# Patient Record
Sex: Male | Born: 1990 | Race: White | Hispanic: No | Marital: Single | Smoking: Never smoker
Health system: Southern US, Community
[De-identification: ages and names within clinical notes are randomized; demographics above are authoritative.]

## PROBLEM LIST (undated history)

## (undated) DIAGNOSIS — K219 Gastro-esophageal reflux disease without esophagitis: Secondary | ICD-10-CM

## (undated) DIAGNOSIS — E785 Hyperlipidemia, unspecified: Secondary | ICD-10-CM

## (undated) DIAGNOSIS — R519 Headache, unspecified: Secondary | ICD-10-CM

## (undated) HISTORY — DX: Headache, unspecified: R51.9

## (undated) HISTORY — DX: Gastro-esophageal reflux disease without esophagitis: K21.9

## (undated) HISTORY — DX: Hyperlipidemia, unspecified: E78.5

## (undated) HISTORY — PX: WISDOM TOOTH EXTRACTION: SHX21

---

## 2018-11-25 ENCOUNTER — Other Ambulatory Visit: Payer: Self-pay

## 2018-11-26 ENCOUNTER — Encounter: Payer: Self-pay | Admitting: Family Medicine

## 2018-11-26 ENCOUNTER — Ambulatory Visit (INDEPENDENT_AMBULATORY_CARE_PROVIDER_SITE_OTHER): Payer: Managed Care, Other (non HMO) | Admitting: Family Medicine

## 2018-11-26 VITALS — BP 116/84 | HR 82 | Temp 98.4°F | Ht 70.25 in | Wt 211.0 lb

## 2018-11-26 DIAGNOSIS — Z13220 Encounter for screening for lipoid disorders: Secondary | ICD-10-CM | POA: Diagnosis not present

## 2018-11-26 DIAGNOSIS — Z0000 Encounter for general adult medical examination without abnormal findings: Secondary | ICD-10-CM | POA: Insufficient documentation

## 2018-11-26 LAB — LIPID PANEL
CHOL/HDL RATIO: 6
Cholesterol: 234 mg/dL — ABNORMAL HIGH (ref 0–200)
HDL: 37.2 mg/dL — ABNORMAL LOW (ref >39.00)
LDL Cholesterol: 174 mg/dL — ABNORMAL HIGH (ref 0–99)
NonHDL: 196.41
Triglycerides: 114 mg/dL (ref 0.0–149.0)
VLDL: 22.8 mg/dL (ref 0.0–40.0)

## 2018-11-26 LAB — COMPREHENSIVE METABOLIC PANEL
ALT: 49 U/L (ref 0–53)
AST: 21 U/L (ref 0–37)
Albumin: 4.7 g/dL (ref 3.5–5.2)
Alkaline Phosphatase: 63 U/L (ref 39–117)
BUN: 16 mg/dL (ref 6–23)
CO2: 30 mEq/L (ref 19–32)
Calcium: 9.9 mg/dL (ref 8.4–10.5)
Chloride: 102 mEq/L (ref 96–112)
Creatinine, Ser: 1.06 mg/dL (ref 0.40–1.50)
GFR: 82.53 mL/min (ref >60.00)
Glucose, Bld: 109 mg/dL — ABNORMAL HIGH (ref 70–99)
POTASSIUM: 4.2 meq/L (ref 3.5–5.1)
Sodium: 139 mEq/L (ref 135–145)
Total Bilirubin: 0.6 mg/dL (ref 0.2–1.2)
Total Protein: 7.4 g/dL (ref 6.0–8.3)

## 2018-11-26 LAB — TSH: TSH: 0.83 u[IU]/mL (ref 0.35–4.50)

## 2018-11-26 LAB — CBC
HCT: 47 % (ref 39.0–52.0)
Hemoglobin: 15.9 g/dL (ref 13.0–17.0)
MCHC: 33.7 g/dL (ref 30.0–36.0)
MCV: 88.4 fl (ref 78.0–100.0)
Platelets: 213 10*3/uL (ref 150.0–400.0)
RBC: 5.32 Mil/uL (ref 4.22–5.81)
RDW: 13 % (ref 11.5–15.5)
WBC: 5.2 10*3/uL (ref 4.0–10.5)

## 2018-11-26 NOTE — Assessment & Plan Note (Signed)
-  Well adult Orders Placed This Encounter  Procedures  . Comp Met (CMET)  . CBC  . Lipid Profile  . TSH  Screening: lipid Immunizations:  Up to date Anticipatory guidance/Risk factor reduction:  Per AVS

## 2018-11-26 NOTE — Patient Instructions (Signed)
Preventive Care 18-39 Years, Male Preventive care refers to lifestyle choices and visits with your health care provider that can promote health and wellness. What does preventive care include?   A yearly physical exam. This is also called an annual well check.  Dental exams once or twice a year.  Routine eye exams. Ask your health care provider how often you should have your eyes checked.  Personal lifestyle choices, including: ? Daily care of your teeth and gums. ? Regular physical activity. ? Eating a healthy diet. ? Avoiding tobacco and drug use. ? Limiting alcohol use. ? Practicing safe sex. What happens during an annual well check? The services and screenings done by your health care provider during your annual well check will depend on your age, overall health, lifestyle risk factors, and family history of disease. Counseling Your health care provider may ask you questions about your:  Alcohol use.  Tobacco use.  Drug use.  Emotional well-being.  Home and relationship well-being.  Sexual activity.  Eating habits.  Work and work environment. Screening You may have the following tests or measurements:  Height, weight, and BMI.  Blood pressure.  Lipid and cholesterol levels. These may be checked every 5 years starting at age 20.  Diabetes screening. This is done by checking your blood sugar (glucose) after you have not eaten for a while (fasting).  Skin check.  Hepatitis C blood test.  Hepatitis B blood test.  Sexually transmitted disease (STD) testing. Discuss your test results, treatment options, and if necessary, the need for more tests with your health care provider. Vaccines Your health care provider may recommend certain vaccines, such as:  Influenza vaccine. This is recommended every year.  Tetanus, diphtheria, and acellular pertussis (Tdap, Td) vaccine. You may need a Td booster every 10 years.  Varicella vaccine. You may need this if you  have not been vaccinated.  HPV vaccine. If you are 26 or younger, you may need three doses over 6 months.  Measles, mumps, and rubella (MMR) vaccine. You may need at least one dose of MMR.You may also need a second dose.  Pneumococcal 13-valent conjugate (PCV13) vaccine. You may need this if you have certain conditions and have not been vaccinated.  Pneumococcal polysaccharide (PPSV23) vaccine. You may need one or two doses if you smoke cigarettes or if you have certain conditions.  Meningococcal vaccine. One dose is recommended if you are age 19-21 years and a first-year college student living in a residence hall, or if you have one of several medical conditions. You may also need additional booster doses.  Hepatitis A vaccine. You may need this if you have certain conditions or if you travel or work in places where you may be exposed to hepatitis A.  Hepatitis B vaccine. You may need this if you have certain conditions or if you travel or work in places where you may be exposed to hepatitis B.  Haemophilus influenzae type b (Hib) vaccine. You may need this if you have certain risk factors. Talk to your health care provider about which screenings and vaccines you need and how often you need them. This information is not intended to replace advice given to you by your health care provider. Make sure you discuss any questions you have with your health care provider. Document Released: 10/22/2001 Document Revised: 04/08/2017 Document Reviewed: 06/27/2015 Elsevier Interactive Patient Education  2019 [employer]  

## 2018-11-26 NOTE — Progress Notes (Signed)
Joshua Boone - 28 y.o. male MRN [id number]  Date of birth: 1990/10/06  Subjective Chief Complaint  Patient presents with  . Establish Care    NP , *fasting, CPE     HPI Joshua Boone is a 28 y.o. male here today to establish with new pcp and for annual exam.  He has no major concerns today.   He has no known medical problems.  His father passed away at age 60 from colon cancer.  He is fairly active and tries to follow a healthy diet.  He is a non-smoker and drinks EtOH socially.   Review of Systems  Constitutional: Negative for chills, fever, malaise/fatigue and weight loss.  HENT: Negative for congestion, ear pain and sore throat.   Eyes: Negative for blurred vision, double vision and pain.  Respiratory: Negative for cough and shortness of breath.   Cardiovascular: Negative for chest pain and palpitations.  Gastrointestinal: Negative for abdominal pain, blood in stool, constipation, heartburn and nausea.  Genitourinary: Negative for dysuria and urgency.  Musculoskeletal: Negative for joint pain and myalgias.  Neurological: Negative for dizziness and headaches.  Endo/Heme/Allergies: Does not bruise/bleed easily.  Psychiatric/Behavioral: Negative for depression. The patient is not nervous/anxious and does not have insomnia.     No Known Allergies  History reviewed. No pertinent past medical history.  Past Surgical History:  Procedure Laterality Date  . WISDOM TOOTH EXTRACTION     2007/2008    Social History   Socioeconomic History  . Marital status: Single    Spouse name: Not on file  . Number of children: Not on file  . Years of education: Not on file  . Highest education level: Not on file  Occupational History  . Not on file  Social Needs  . Financial resource strain: Not on file  . Food insecurity:    Worry: Not on file    Inability: Not on file  . Transportation needs:    Medical: Not on file    Non-medical: Not on file  Tobacco Use  . Smoking status: Never Smoker   . Smokeless tobacco: Never Used  Substance and Sexual Activity  . Alcohol use: Yes    Comment: socially   . Drug use: Not Currently  . Sexual activity: Yes    Birth control/protection: Condom  Lifestyle  . Physical activity:    Days per week: 3 days    Minutes per session: 40 min  . Stress: To some extent  Relationships  . Social connections:    Talks on phone: Twice a week    Gets together: Twice a week    Attends religious service: Not on file    Active member of club or organization: Not on file    Attends meetings of clubs or organizations: Not on file    Relationship status: Not on file  Other Topics Concern  . Not on file  Social History Narrative  . Not on file    Family History  Problem Relation Age of Onset  . Colon cancer Father 44  . Alzheimer's disease Paternal Grandfather     Health Maintenance  Topic Date Due  . HIV Screening  10/16/2004  . TETANUS/TDAP  10/16/2008  . INFLUENZA VACCINE  04/09/2018    ----------------------------------------------------------------------------------------------------------------------------------------------------------------------------------------------------------------- Physical Exam BP 116/84   Pulse 82   Temp 98.4 F (36.9 C) (Oral)   Ht 5' 10.25" (1.784 m)   Wt 211 lb (95.7 kg)   SpO2 97%   BMI 30.06 kg/m  Physical Exam Constitutional:      General: He is not in acute distress. HENT:     Head: Normocephalic and atraumatic.     Right Ear: External ear normal.     Left Ear: External ear normal.     Mouth/Throat:     Mouth: Mucous membranes are moist.  Eyes:     General: No scleral icterus. Neck:     Musculoskeletal: Normal range of motion.     Thyroid: No thyromegaly.  Cardiovascular:     Rate and Rhythm: Normal rate and regular rhythm.     Heart sounds: Normal heart sounds.  Pulmonary:     Effort: Pulmonary effort is normal.     Breath sounds: Normal breath sounds.  Abdominal:     General:  Bowel sounds are normal. There is no distension.     Palpations: Abdomen is soft.     Tenderness: There is no abdominal tenderness. There is no guarding.  Lymphadenopathy:     Cervical: No cervical adenopathy.  Skin:    General: Skin is warm and dry.     Findings: No rash.  Neurological:     Mental Status: He is alert and oriented to person, place, and time.     Cranial Nerves: No cranial nerve deficit.     Motor: No abnormal muscle tone.  Psychiatric:        Mood and Affect: Mood normal.        Behavior: Behavior normal.     ------------------------------------------------------------------------------------------------------------------------------------------------------------------------------------------------------------------- Assessment and Plan  Well adult exam -Well adult Orders Placed This Encounter  Procedures  . Comp Met (CMET)  . CBC  . Lipid Profile  . TSH  Screening: lipid Immunizations:  Up to date Anticipatory guidance/Risk factor reduction:  Per AVS

## 2018-12-09 ENCOUNTER — Encounter: Payer: Self-pay | Admitting: Family Medicine

## 2020-02-02 ENCOUNTER — Encounter: Payer: Self-pay | Admitting: Family Medicine

## 2020-03-01 ENCOUNTER — Other Ambulatory Visit: Payer: Self-pay

## 2020-03-02 ENCOUNTER — Ambulatory Visit (INDEPENDENT_AMBULATORY_CARE_PROVIDER_SITE_OTHER): Payer: BLUE CROSS/BLUE SHIELD | Admitting: Family Medicine

## 2020-03-02 ENCOUNTER — Encounter: Payer: Self-pay | Admitting: Family Medicine

## 2020-03-02 VITALS — BP 118/76 | HR 85 | Temp 98.7°F | Ht 71.0 in | Wt 209.4 lb

## 2020-03-02 DIAGNOSIS — Z0000 Encounter for general adult medical examination without abnormal findings: Secondary | ICD-10-CM

## 2020-03-02 LAB — LIPID PANEL
Cholesterol: 234 mg/dL — ABNORMAL HIGH (ref 0–200)
HDL: 34.6 mg/dL — ABNORMAL LOW (ref >39.00)
LDL Cholesterol: 178 mg/dL — ABNORMAL HIGH (ref 0–99)
NonHDL: 199.37
Total CHOL/HDL Ratio: 7
Triglycerides: 107 mg/dL (ref 0.0–149.0)
VLDL: 21.4 mg/dL (ref 0.0–40.0)

## 2020-03-02 LAB — CBC
HCT: 44.1 % (ref 39.0–52.0)
Hemoglobin: 15 g/dL (ref 13.0–17.0)
MCHC: 34.1 g/dL (ref 30.0–36.0)
MCV: 87.3 fl (ref 78.0–100.0)
Platelets: 192 10*3/uL (ref 150.0–400.0)
RBC: 5.05 Mil/uL (ref 4.22–5.81)
RDW: 13 % (ref 11.5–15.5)
WBC: 5.1 10*3/uL (ref 4.0–10.5)

## 2020-03-02 LAB — COMPREHENSIVE METABOLIC PANEL
ALT: 29 U/L (ref 0–53)
AST: 15 U/L (ref 0–37)
Albumin: 4.8 g/dL (ref 3.5–5.2)
Alkaline Phosphatase: 68 U/L (ref 39–117)
BUN: 12 mg/dL (ref 6–23)
CO2: 27 mEq/L (ref 19–32)
Calcium: 9.6 mg/dL (ref 8.4–10.5)
Chloride: 104 mEq/L (ref 96–112)
Creatinine, Ser: 0.93 mg/dL (ref 0.40–1.50)
GFR: 95.16 mL/min (ref >60.00)
Glucose, Bld: 105 mg/dL — ABNORMAL HIGH (ref 70–99)
Potassium: 4.2 mEq/L (ref 3.5–5.1)
Sodium: 139 mEq/L (ref 135–145)
Total Bilirubin: 0.7 mg/dL (ref 0.2–1.2)
Total Protein: 7 g/dL (ref 6.0–8.3)

## 2020-03-02 LAB — LDL CHOLESTEROL, DIRECT: Direct LDL: 173 mg/dL

## 2020-03-02 LAB — HEMOGLOBIN A1C: Hgb A1c MFr Bld: 5.7 % (ref 4.6–6.5)

## 2020-03-02 NOTE — Progress Notes (Signed)
Established Patient Office Visit  Subjective:  Patient ID: Joshua Boone, male    DOB: September 14, 1990  Age: 29 y.o. MRN: [id number]  CC:  Chief Complaint  Patient presents with  . Transitions Of Care    toc from Joshua Boone, no concerns.     HPI Joshua Boone presents for a complete physical exam.  He is healthy as far as he knows.  Aware of his elevated LDL cholesterol seen with his last visit.  Has been adjusting his diet and exercising more.  Married and just bought his first home through this pandemic has had trouble obtaining moved furniture.  Dad was diagnosed with colon cancer at age 72 History reviewed. No pertinent past medical history.  Past Surgical History:  Procedure Laterality Date  . WISDOM TOOTH EXTRACTION     2007/2008    Family History  Problem Relation Age of Onset  . Colon cancer Father 3  . Alzheimer's disease Paternal Grandfather     Social History   Socioeconomic History  . Marital status: Single    Spouse name: Not on file  . Number of children: Not on file  . Years of education: Not on file  . Highest education level: Not on file  Occupational History  . Not on file  Tobacco Use  . Smoking status: Never Smoker  . Smokeless tobacco: Never Used  Vaping Use  . Vaping Use: Never used  Substance and Sexual Activity  . Alcohol use: Yes    Comment: socially   . Drug use: Not Currently  . Sexual activity: Yes    Birth control/protection: Condom  Other Topics Concern  . Not on file  Social History Narrative  . Not on file   Social Determinants of Health   Financial Resource Strain:   . Difficulty of Paying Living Expenses:   Food Insecurity:   . Worried About Charity fundraiser in the Last Year:   . Arboriculturist in the Last Year:   Transportation Needs:   . Film/video editor (Medical):   Marland Kitchen Lack of Transportation (Non-Medical):   Physical Activity:   . Days of Exercise per Week:   . Minutes of Exercise per Session:   Stress:   .  Feeling of Stress :   Social Connections:   . Frequency of Communication with Friends and Family:   . Frequency of Social Gatherings with Friends and Family:   . Attends Religious Services:   . Active Member of Clubs or Organizations:   . Attends Archivist Meetings:   Marland Kitchen Marital Status:   Intimate Partner Violence:   . Fear of Current or Ex-Partner:   . Emotionally Abused:   Marland Kitchen Physically Abused:   . Sexually Abused:     No outpatient medications prior to visit.   No facility-administered medications prior to visit.    No Known Allergies  ROS Review of Systems  Constitutional: Negative.   HENT: Negative.   Eyes: Negative for photophobia and visual disturbance.  Respiratory: Negative.   Cardiovascular: Negative.   Gastrointestinal: Negative.   Endocrine: Negative for polyphagia and polyuria.  Genitourinary: Negative.   Musculoskeletal: Negative for gait problem and joint swelling.  Allergic/Immunologic: Negative for immunocompromised state.  Neurological: Negative for tremors and speech difficulty.  Hematological: Does not bruise/bleed easily.      Objective:    Physical Exam Vitals and nursing note reviewed.  Constitutional:      General: He is not in acute distress.  Appearance: Normal appearance. He is normal weight. He is not ill-appearing, toxic-appearing or diaphoretic.  HENT:     Head: Normocephalic and atraumatic.     Right Ear: Tympanic membrane, ear canal and external ear normal.     Left Ear: Ear canal and external ear normal.     Mouth/Throat:     Mouth: Mucous membranes are dry.     Pharynx: Oropharynx is clear.  Eyes:     General: No scleral icterus.       Right eye: No discharge.        Left eye: No discharge.     Extraocular Movements: Extraocular movements intact.     Conjunctiva/sclera: Conjunctivae normal.     Pupils: Pupils are equal, round, and reactive to light.  Cardiovascular:     Rate and Rhythm: Normal rate and regular  rhythm.  Pulmonary:     Effort: Pulmonary effort is normal.     Breath sounds: Normal breath sounds.  Abdominal:     General: Abdomen is flat. Bowel sounds are normal. There is no distension.     Palpations: Abdomen is soft. There is no mass.     Tenderness: There is no abdominal tenderness. There is no guarding or rebound.     Hernia: No hernia is present. There is no hernia in the left inguinal area or right inguinal area.  Genitourinary:    Penis: Normal and circumcised. No hypospadias, erythema, tenderness, discharge, swelling or lesions.      Testes:        Right: Mass, tenderness or swelling not present. Right testis is descended.        Left: Mass, tenderness or swelling not present. Left testis is descended.     Epididymis:     Right: Not inflamed or enlarged.     Left: Not inflamed or enlarged.  Musculoskeletal:     Cervical back: Normal range of motion. No rigidity or tenderness.     Right lower leg: No edema.     Left lower leg: No edema.  Lymphadenopathy:     Cervical: No cervical adenopathy.     Lower Body: No right inguinal adenopathy. No left inguinal adenopathy.  Skin:    General: Skin is warm and dry.  Neurological:     General: No focal deficit present.     Mental Status: He is alert and oriented to person, place, and time.  Psychiatric:        Mood and Affect: Mood normal.        Behavior: Behavior normal.     BP 118/76   Pulse 85   Temp 98.7 F (37.1 C) (Tympanic)   Ht 5\' 11"  (1.803 m)   Wt 209 lb 6.4 oz (95 kg)   SpO2 96%   BMI 29.21 kg/m  Wt Readings from Last 3 Encounters:  03/02/20 209 lb 6.4 oz (95 kg)  11/26/18 211 lb (95.7 kg)     Health Maintenance Due  Topic Date Due  . Hepatitis C Screening  Never done  . HIV Screening  Never done    There are no preventive care reminders to display for this patient.  Lab Results  Component Value Date   TSH 0.83 11/26/2018   Lab Results  Component Value Date   WBC 5.2 11/26/2018   HGB 15.9  11/26/2018   HCT 47.0 11/26/2018   MCV 88.4 11/26/2018   PLT 213.0 11/26/2018   Lab Results  Component Value Date   NA 139 11/26/2018  K 4.2 11/26/2018   CO2 30 11/26/2018   GLUCOSE 109 (H) 11/26/2018   BUN 16 11/26/2018   CREATININE 1.06 11/26/2018   BILITOT 0.6 11/26/2018   ALKPHOS 63 11/26/2018   AST 21 11/26/2018   ALT 49 11/26/2018   PROT 7.4 11/26/2018   ALBUMIN 4.7 11/26/2018   CALCIUM 9.9 11/26/2018   GFR 82.53 11/26/2018   Lab Results  Component Value Date   CHOL 234 (H) 11/26/2018   Lab Results  Component Value Date   HDL 37.20 (L) 11/26/2018   Lab Results  Component Value Date   LDLCALC 174 (H) 11/26/2018   Lab Results  Component Value Date   TRIG 114.0 11/26/2018   Lab Results  Component Value Date   CHOLHDL 6 11/26/2018   No results found for: HGBA1C    Assessment & Plan:   Problem List Items Addressed This Visit    None    Visit Diagnoses    Healthcare maintenance    -  Primary   Relevant Orders   CBC   Comprehensive metabolic panel   LDL cholesterol, direct   Hemoglobin A1c   HIV Antibody (routine testing w rflx)   Lipid panel   Urinalysis, Routine w reflex microscopic   Hepatitis C antibody      No orders of the defined types were placed in this encounter.   Follow-up: Return in about 1 year (around 03/02/2021), or if symptoms worsen or fail to improve.  Given information on health maintenance disease prevention as well as preventing high cholesterol.  Continues heart healthy diet.  Expecting lower LDL cholesterol.  Mliss Sax, MD

## 2020-03-02 NOTE — Addendum Note (Signed)
Addended by: Varney Biles on: 03/02/2020 08:45 AM   Modules accepted: Orders

## 2020-03-02 NOTE — Patient Instructions (Signed)
Health Maintenance, Male Adopting a healthy lifestyle and getting preventive care are important in promoting health and wellness. Ask your health care provider about:  The right schedule for you to have regular tests and exams.  Things you can do on your own to prevent diseases and keep yourself healthy. What should I know about diet, weight, and exercise? Eat a healthy diet   Eat a diet that includes plenty of vegetables, fruits, low-fat dairy products, and lean protein.  Do not eat a lot of foods that are high in solid fats, added sugars, or sodium. Maintain a healthy weight Body mass index (BMI) is a measurement that can be used to identify possible weight problems. It estimates body fat based on height and weight. Your health care provider can help determine your BMI and help you achieve or maintain a healthy weight. Get regular exercise Get regular exercise. This is one of the most important things you can do for your health. Most adults should:  Exercise for at least 150 minutes each week. The exercise should increase your heart rate and make you sweat (moderate-intensity exercise).  Do strengthening exercises at least twice a week. This is in addition to the moderate-intensity exercise.  Spend less time sitting. Even light physical activity can be beneficial. Watch cholesterol and blood lipids Have your blood tested for lipids and cholesterol at 30 years of age, then have this test every 5 years. You may need to have your cholesterol levels checked more often if:  Your lipid or cholesterol levels are high.  You are older than 30 years of age.  You are at high risk for heart disease. What should I know about cancer screening? Many types of cancers can be detected early and may often be prevented. Depending on your health history and family history, you may need to have cancer screening at various ages. This may include screening for:  Colorectal cancer.  Prostate  cancer.  Skin cancer.  Lung cancer. What should I know about heart disease, diabetes, and high blood pressure? Blood pressure and heart disease  High blood pressure causes heart disease and increases the risk of stroke. This is more likely to develop in people who have high blood pressure readings, are of African descent, or are overweight.  Talk with your health care provider about your target blood pressure readings.  Have your blood pressure checked: ? Every 3-5 years if you are 18-39 years of age. ? Every year if you are 40 years old or older.  If you are between the ages of 65 and 75 and are a current or former smoker, ask your health care provider if you should have a one-time screening for abdominal aortic aneurysm (AAA). Diabetes Have regular diabetes screenings. This checks your fasting blood sugar level. Have the screening done:  Once every three years after age 45 if you are at a normal weight and have a low risk for diabetes.  More often and at a younger age if you are overweight or have a high risk for diabetes. What should I know about preventing infection? Hepatitis B If you have a higher risk for hepatitis B, you should be screened for this virus. Talk with your health care provider to find out if you are at risk for hepatitis B infection. Hepatitis C Blood testing is recommended for:  Everyone born from 1945 through 1965.  Anyone with known risk factors for hepatitis C. Sexually transmitted infections (STIs)  You should be screened each year   for STIs, including gonorrhea and chlamydia, if: ? You are sexually active and are younger than 30 years of age. ? You are older than 30 years of age and your health care provider tells you that you are at risk for this type of infection. ? Your sexual activity has changed since you were last screened, and you are at increased risk for chlamydia or gonorrhea. Ask your health care provider if you are at risk.  Ask your  health care provider about whether you are at high risk for HIV. Your health care provider may recommend a prescription medicine to help prevent HIV infection. If you choose to take medicine to prevent HIV, you should first get tested for HIV. You should then be tested every 3 months for as long as you are taking the medicine. Follow these instructions at home: Lifestyle  Do not use any products that contain nicotine or tobacco, such as cigarettes, e-cigarettes, and chewing tobacco. If you need help quitting, ask your health care provider.  Do not use street drugs.  Do not share needles.  Ask your health care provider for help if you need support or information about quitting drugs. Alcohol use  Do not drink alcohol if your health care provider tells you not to drink.  If you drink alcohol: ? Limit how much you have to 0-2 drinks a day. ? Be aware of how much alcohol is in your drink. In the [place] one drink equals one 12 oz bottle of beer (355 mL), one 5 oz glass of wine (148 mL), or one 1 oz glass of hard liquor (44 mL). General instructions  Schedule regular health, dental, and eye exams.  Stay current with your vaccines.  Tell your health care provider if: ? You often feel depressed. ? You have ever been abused or do not feel safe at home. Summary  Adopting a healthy lifestyle and getting preventive care are important in promoting health and wellness.  Follow your health care provider's instructions about healthy diet, exercising, and getting tested or screened for diseases.  Follow your health care provider's instructions on monitoring your cholesterol and blood pressure. This information is not intended to replace advice given to you by your health care provider. Make sure you discuss any questions you have with your health care provider. Document Revised: 08/19/2018 Document Reviewed: 08/19/2018 Elsevier Patient Education  2020 Elsevier Inc.  Preventive Care 21-39 Years  Old, Male Preventive care refers to lifestyle choices and visits with your health care provider that can promote health and wellness. This includes:  A yearly physical exam. This is also called an annual well check.  Regular dental and eye exams.  Immunizations.  Screening for certain conditions.  Healthy lifestyle choices, such as eating a healthy diet, getting regular exercise, not using drugs or products that contain nicotine and tobacco, and limiting alcohol use. What can I expect for my preventive care visit? Physical exam Your health care provider will check:  Height and weight. These may be used to calculate body mass index (BMI), which is a measurement that tells if you are at a healthy weight.  Heart rate and blood pressure.  Your skin for abnormal spots. Counseling Your health care provider may ask you questions about:  Alcohol, tobacco, and drug use.  Emotional well-being.  Home and relationship well-being.  Sexual activity.  Eating habits.  Work and work environment. What immunizations do I need?  Influenza (flu) vaccine  This is recommended every year. Tetanus, diphtheria,   and pertussis (Tdap) vaccine  You may need a Td booster every 10 years. Varicella (chickenpox) vaccine  You may need this vaccine if you have not already been vaccinated. Human papillomavirus (HPV) vaccine  If recommended by your health care provider, you may need three doses over 6 months. Measles, mumps, and rubella (MMR) vaccine  You may need at least one dose of MMR. You may also need a second dose. Meningococcal conjugate (MenACWY) vaccine  One dose is recommended if you are 73-28 years old and a Market researcher living in a residence hall, or if you have one of several medical conditions. You may also need additional booster doses. Pneumococcal conjugate (PCV13) vaccine  You may need this if you have certain conditions and were not previously  vaccinated. Pneumococcal polysaccharide (PPSV23) vaccine  You may need one or two doses if you smoke cigarettes or if you have certain conditions. Hepatitis A vaccine  You may need this if you have certain conditions or if you travel or work in places where you may be exposed to hepatitis A. Hepatitis B vaccine  You may need this if you have certain conditions or if you travel or work in places where you may be exposed to hepatitis B. Haemophilus influenzae type b (Hib) vaccine  You may need this if you have certain risk factors. You may receive vaccines as individual doses or as more than one vaccine together in one shot (combination vaccines). Talk with your health care provider about the risks and benefits of combination vaccines. What tests do I need? Blood tests  Lipid and cholesterol levels. These may be checked every 5 years starting at age 80.  Hepatitis C test.  Hepatitis B test. Screening   Diabetes screening. This is done by checking your blood sugar (glucose) after you have not eaten for a while (fasting).  Sexually transmitted disease (STD) testing. Talk with your health care provider about your test results, treatment options, and if necessary, the need for more tests. Follow these instructions at home: Eating and drinking   Eat a diet that includes fresh fruits and vegetables, whole grains, lean protein, and low-fat dairy products.  Take vitamin and mineral supplements as recommended by your health care provider.  Do not drink alcohol if your health care provider tells you not to drink.  If you drink alcohol: ? Limit how much you have to 0-2 drinks a day. ? Be aware of how much alcohol is in your drink. In the [place] one drink equals one 12 oz bottle of beer (355 mL), one 5 oz glass of wine (148 mL), or one 1 oz glass of hard liquor (44 mL). Lifestyle  Take daily care of your teeth and gums.  Stay active. Exercise for at least 30 minutes on 5 or more days  each week.  Do not use any products that contain nicotine or tobacco, such as cigarettes, e-cigarettes, and chewing tobacco. If you need help quitting, ask your health care provider.  If you are sexually active, practice safe sex. Use a condom or other form of protection to prevent STIs (sexually transmitted infections). What's next?  Go to your health care provider once a year for a well check visit.  Ask your health care provider how often you should have your eyes and teeth checked.  Stay up to date on all vaccines. This information is not intended to replace advice given to you by your health care provider. Make sure you discuss any questions you  have with your health care provider. Document Revised: 08/20/2018 Document Reviewed: 08/20/2018 Elsevier Patient Education  2020 Elsevier Inc.  Preventing High Cholesterol Cholesterol is a white, waxy substance similar to fat that the human body needs to help build cells. The liver makes all the cholesterol that a person's body needs. Having high cholesterol (hypercholesterolemia) increases a person's risk for heart disease and stroke. Extra (excess) cholesterol comes from the food the person eats. High cholesterol can often be prevented with diet and lifestyle changes. If you already have high cholesterol, you can control it with diet and lifestyle changes and with medicine. How can high cholesterol affect me? If you have high cholesterol, deposits (plaques) may build up on the walls of your arteries. The arteries are the blood vessels that carry blood away from your heart. Plaques make the arteries narrower and stiffer. This can limit or block blood flow and cause blood clots to form. Blood clots:  Are tiny balls of cells that form in your blood.  Can move to the heart or brain, causing a heart attack or stroke. Plaques in arteries greatly increase your risk for heart attack and stroke.Making diet and lifestyle changes can reduce your risk  for these conditions that may threaten your life. What can increase my risk? This condition is more likely to develop in people who:  Eat foods that are high in saturated fat or cholesterol. Saturated fat is mostly found in: ? Foods that contain animal fat, such as red meat and some dairy products. ? Certain fatty foods made from plants, such as tropical oils.  Are overweight.  Are not getting enough exercise.  Have a family history of high cholesterol. What actions can I take to prevent this? Nutrition   Eat less saturated fat.  Avoid trans fats (partially hydrogenated oils). These are often found in margarine and in some baked goods, fried foods, and snacks bought in packages.  Avoid precooked or cured meat, such as sausages or meat loaves.  Avoid foods and drinks that have added sugars.  Eat more fruits, vegetables, and whole grains.  Choose healthy sources of protein, such as fish, poultry, lean cuts of red meat, beans, peas, lentils, and nuts.  Choose healthy sources of fat, such as: ? Nuts. ? Vegetable oils, especially olive oil. ? Fish that have healthy fats (omega-3 fatty acids), such as mackerel or salmon. The items listed above may not be a complete list of recommended foods and beverages. Contact a dietitian for more information. Lifestyle  Lose weight if you are overweight. Losing 5-10 lb (2.3-4.5 kg) can help prevent or control high cholesterol. It can also lower your risk for diabetes and high blood pressure. Ask your health care provider to help you with a diet and exercise plan to lose weight safely.  Do not use any products that contain nicotine or tobacco, such as cigarettes, e-cigarettes, and chewing tobacco. If you need help quitting, ask your health care provider.  Limit your alcohol intake. ? Do not drink alcohol if:  Your health care provider tells you not to drink.  You are pregnant, may be pregnant, or are planning to become pregnant. ? If you  drink alcohol:  Limit how much you use to:  0-1 drink a day for women.  0-2 drinks a day for men.  Be aware of how much alcohol is in your drink. In the [place] one drink equals one 12 oz bottle of beer (355 mL), one 5 oz glass of wine (148   mL), or one 1 oz glass of hard liquor (44 mL). Activity   Get enough exercise. Each week, do at least 150 minutes of exercise that takes a medium level of effort (moderate-intensity exercise). ? This is exercise that:  Makes your heart beat faster and makes you breathe harder than usual.  Allows you to still be able to talk. ? You could exercise in short sessions several times a day or longer sessions a few times a week. For example, on 5 days each week, you could walk fast or ride your bike 3 times a day for 10 minutes each time.  Do exercises as told by your health care provider. Medicines  In addition to diet and lifestyle changes, your health care provider may recommend medicines to help lower cholesterol. This may be a medicine to lower the amount of cholesterol your liver makes. You may need medicine if: ? Diet and lifestyle changes do not lower your cholesterol enough. ? You have high cholesterol and other risk factors for heart disease or stroke.  Take over-the-counter and prescription medicines only as told by your health care provider. General information  Manage your risk factors for high cholesterol. Talk with your health care provider about all your risk factors and how to lower your risk.  Manage other conditions that you have, such as diabetes or high blood pressure (hypertension).  Have blood tests to check your cholesterol levels at regular points in time as told by your health care provider.  Keep all follow-up visits as told by your health care provider. This is important. Where to find more information  American Heart Association: www.heart.org  National Heart, Lung, and Blood Institute:  https://wilson-eaton.com/ Summary  High cholesterol increases your risk for heart disease and stroke. By keeping your cholesterol level low, you can reduce your risk for these conditions.  High cholesterol can often be prevented with diet and lifestyle changes.  Work with your health care provider to manage your risk factors, and have your blood tested regularly. This information is not intended to replace advice given to you by your health care provider. Make sure you discuss any questions you have with your health care provider. Document Revised: 12/18/2018 Document Reviewed: 05/04/2016 Elsevier Patient Education  2020 [employer].

## 2020-03-03 LAB — HEPATITIS C ANTIBODY
Hepatitis C Ab: NONREACTIVE
SIGNAL TO CUT-OFF: 0.01 (ref <1.00)

## 2020-03-03 LAB — HIV ANTIBODY (ROUTINE TESTING W REFLEX): HIV 1&2 Ab, 4th Generation: NONREACTIVE

## 2020-04-17 ENCOUNTER — Other Ambulatory Visit: Payer: Self-pay

## 2020-04-17 ENCOUNTER — Ambulatory Visit (INDEPENDENT_AMBULATORY_CARE_PROVIDER_SITE_OTHER): Payer: Self-pay | Admitting: Family Medicine

## 2020-04-17 ENCOUNTER — Encounter: Payer: Self-pay | Admitting: Family Medicine

## 2020-04-17 VITALS — BP 120/82 | HR 78 | Temp 98.1°F | Resp 16 | Ht 71.0 in | Wt 212.0 lb

## 2020-04-17 DIAGNOSIS — M7989 Other specified soft tissue disorders: Secondary | ICD-10-CM

## 2020-04-17 MED ORDER — AMOXICILLIN-POT CLAVULANATE 875-125 MG PO TABS: 1.0000 | ORAL_TABLET | Freq: Two times a day (BID) | ORAL | 0 refills | Status: DC

## 2020-04-17 MED ORDER — PREDNISONE 20 MG PO TABS: ORAL_TABLET | ORAL | 0 refills | Status: DC

## 2020-04-17 NOTE — Patient Instructions (Signed)
It was good to see you today- use the prednisone as directed.  If not better in a couple of days please start the augmentin and get in touch with me Ice, benadryl at night may help
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
## 2020-08-22 ENCOUNTER — Other Ambulatory Visit: Payer: Self-pay

## 2020-08-22 ENCOUNTER — Encounter: Payer: Self-pay | Admitting: Internal Medicine

## 2020-08-22 ENCOUNTER — Ambulatory Visit: Payer: BC Managed Care – PPO | Admitting: Internal Medicine

## 2020-08-22 VITALS — BP 126/84 | HR 75 | Temp 98.3°F | Resp 16 | Ht 71.0 in | Wt 236.0 lb

## 2020-08-22 DIAGNOSIS — M542 Cervicalgia: Secondary | ICD-10-CM

## 2020-08-22 DIAGNOSIS — M5412 Radiculopathy, cervical region: Secondary | ICD-10-CM | POA: Diagnosis not present

## 2020-08-22 DIAGNOSIS — G43409 Hemiplegic migraine, not intractable, without status migrainosus: Secondary | ICD-10-CM | POA: Diagnosis not present

## 2020-08-22 DIAGNOSIS — Z23 Encounter for immunization: Secondary | ICD-10-CM | POA: Diagnosis not present

## 2020-08-22 DIAGNOSIS — R519 Headache, unspecified: Secondary | ICD-10-CM | POA: Diagnosis not present

## 2020-08-22 DIAGNOSIS — G4459 Other complicated headache syndrome: Secondary | ICD-10-CM

## 2020-08-22 MED ORDER — UBRELVY 100 MG PO TABS: 1.0000 | ORAL_TABLET | Freq: Every day | ORAL | 0 refills | Status: DC | PRN

## 2020-08-22 NOTE — Progress Notes (Signed)
Subjective:  Patient ID: Joshua Boone, male    DOB: 07/26/1991  Age: 29 y.o. MRN: [id number]  CC: Headache  This visit occurred during the SARS-CoV-2 public health emergency.  Safety protocols were in place, including screening questions prior to the visit, additional usage of staff PPE, and extensive cleaning of exam room while observing appropriate contact time as indicated for disinfecting solutions.    HPI Joshua Boone presents for concerns about headache and neck pain. He tells me he has a history of occipital migraines but over the last month he has developed a new headache syndrome. He describes an upper neck pain that radiates into his posterior head. He describes the pain in the neck as a stiffness and the headache as a throbbing sensation. The neck pain radiates to the left. The last time he had the pain was about a week ago. He takes Advil and gets some symptom relief. 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 He denies nausea, vomiting, visual disturbance, or paresthesias. He sleeps about 8 to 9 hours a night and there is no history of snoring or sleep apnea. He denies any history of trauma or injury.  History Joshua Boone has a past medical history of Frequent headaches, GERD (gastroesophageal reflux disease), and Hyperlipidemia.   He has a past surgical history that includes Wisdom tooth extraction.   His family history includes Alzheimer's disease in his paternal grandfather; Birth defects in his brother; Colon cancer (age of onset: 53) in his father; Diabetes in his brother; Early death in his brother; Hearing loss in his brother; Learning disabilities in his brother.He reports that he has never smoked. He has never used smokeless tobacco. He reports current alcohol use. He reports previous drug use.  Outpatient Medications Prior to Visit  Medication Sig Dispense Refill  . calcium carbonate (TUMS) 500 MG chewable tablet Chew 1 tablet by mouth as needed for indigestion or  heartburn.    . Coenzyme Q10 (CO Q 10 PO) Take by mouth.    . Fish Oil-Cholecalciferol (OMEGA-3 + D PO) Take by mouth.    . Ibuprofen (ADVIL PO) Take by mouth as needed.    . Multiple Vitamin (MULTIVITAMIN PO) Take by mouth.    Marland Kitchen amoxicillin-clavulanate (AUGMENTIN) 875-125 MG tablet Take 1 tablet by mouth 2 (two) times daily. 20 tablet 0  . predniSONE (DELTASONE) 20 MG tablet Take 40 mg daily for 3 days, then 20 mg daily for 3 days 9 tablet 0   No facility-administered medications prior to visit.    ROS Review of Systems  Constitutional: Negative.  Negative for diaphoresis and fatigue.  HENT: Negative.  Negative for trouble swallowing.   Eyes: Negative.  Negative for visual disturbance.  Respiratory: Negative for cough, chest tightness, shortness of breath and wheezing.   Cardiovascular: Negative for chest pain, palpitations and leg swelling.  Gastrointestinal: Negative for abdominal pain, constipation, nausea and vomiting.  Endocrine: Negative.   Genitourinary: Negative.  Negative for difficulty urinating.  Musculoskeletal: Positive for neck pain and neck stiffness. Negative for arthralgias and back pain.  Skin: Negative.   Neurological: Positive for headaches. Negative for dizziness, weakness and numbness.  Hematological: Negative for adenopathy. Does not bruise/bleed easily.  Psychiatric/Behavioral: Negative.     Objective:  BP 126/84   Pulse 75   Temp 98.3 F (36.8 C) (Oral)   Resp 16   Ht 5\' 11"  (1.803 m)   Wt 236 lb (107 kg)   SpO2 98%   BMI 32.92 kg/m  Physical Exam Vitals reviewed.  Constitutional:      Appearance: He is well-developed.  HENT:     Head: Normocephalic.     Right Ear: Tympanic membrane normal.     Nose: Nose normal.  Eyes:     General: No visual field deficit or scleral icterus.    Extraocular Movements: Extraocular movements intact.     Right eye: Normal extraocular motion and no nystagmus.     Left eye: Normal extraocular motion and no  nystagmus.     Pupils: Pupils are equal, round, and reactive to light. Pupils are equal.     Right eye: Pupil is round and reactive.     Left eye: Pupil is round and reactive.  Cardiovascular:     Rate and Rhythm: Normal rate and regular rhythm.     Heart sounds: No murmur heard.   Pulmonary:     Effort: Pulmonary effort is normal.     Breath sounds: No stridor. No wheezing, rhonchi or rales.  Abdominal:     General: There is no distension.     Palpations: Abdomen is soft.     Tenderness: There is no abdominal tenderness.  Musculoskeletal:        General: Normal range of motion.     Cervical back: Normal range of motion and neck supple. No rigidity.  Skin:    General: Skin is warm and dry.  Neurological:     General: No focal deficit present.     Mental Status: He is alert and oriented to person, place, and time.     Cranial Nerves: No cranial nerve deficit, dysarthria or facial asymmetry.     Motor: No weakness.     Coordination: Romberg sign negative. Coordination normal.     Gait: Gait normal.     Deep Tendon Reflexes: Reflexes abnormal. Babinski sign absent on the right side. Babinski sign absent on the left side.     Reflex Scores:      Tricep reflexes are 1+ on the right side and 1+ on the left side.      Bicep reflexes are 1+ on the right side and 1+ on the left side.      Brachioradialis reflexes are 1+ on the right side and 1+ on the left side.      Patellar reflexes are 2+ on the right side and 1+ on the left side.      Achilles reflexes are 1+ on the right side and 0 on the left side. Psychiatric:        Mood and Affect: Mood normal. Mood is not anxious or depressed.        Speech: Speech normal.        Behavior: Behavior normal.     Lab Results  Component Value Date   WBC 5.1 03/02/2020   HGB 15.0 03/02/2020   HCT 44.1 03/02/2020   PLT 192.0 03/02/2020   GLUCOSE 105 (H) 03/02/2020   CHOL 234 (H) 03/02/2020   TRIG 107.0 03/02/2020   HDL 34.60 (L)  03/02/2020   LDLDIRECT 173.0 03/02/2020   LDLCALC 178 (H) 03/02/2020   ALT 29 03/02/2020   AST 15 03/02/2020   NA 139 03/02/2020   K 4.2 03/02/2020   CL 104 03/02/2020   CREATININE 0.93 03/02/2020   BUN 12 03/02/2020   CO2 27 03/02/2020   TSH 0.83 11/26/2018   HGBA1C 5.7 03/02/2020    Assessment & Plan:   Joshua Boone was seen today for headache.  Diagnoses  and all orders for this visit:  Hemiplegic migraine without status migrainosus, not intractable- I recommended that he try a CGRP antagonist for this. -     Ubrogepant (UBRELVY) 100 MG TABS; Take 1 tablet by mouth daily as needed. -     MR Brain Wo Contrast; Future  Occipital headache -     MR Brain Wo Contrast; Future  Radiculitis of left cervical region- I recommended that he undergo an MRI of the cervical spine to screen for spinal stenosis, disc herniation, nerve impingement, tumor, mass, or demyelination. -     MR Cervical Spine Wo Contrast; Future  Cervicalgia -     MR Cervical Spine Wo Contrast; Future  Other complicated headache syndrome- He has a new onset posterior headache and a decreased deep tendon reflex in the left patella. I recommended that he undergo an MRI of the brain to screen for mass, NPH, demyelination, bleed, or tumor. -     MR Brain Wo Contrast; Future  Other orders -     Flu Vaccine QUAD 6+ mos PF IM (Fluarix Quad PF)   I have discontinued Joshua Boone's predniSONE and amoxicillin-clavulanate. I am also having him start on Ubrelvy. Additionally, I am having him maintain his Fish Oil-Cholecalciferol (OMEGA-3 + D PO), Multiple Vitamin (MULTIVITAMIN PO), Coenzyme Q10 (CO Q 10 PO), calcium carbonate, and Ibuprofen (ADVIL PO).  Meds ordered this encounter  Medications  . Ubrogepant (UBRELVY) 100 MG TABS    Sig: Take 1 tablet by mouth daily as needed.    Dispense:  2 tablet    Refill:  0     Follow-up: Return in about 3 weeks (around 09/12/2020).  Sanda Linger, MD

## 2020-08-22 NOTE — Patient Instructions (Signed)

## 2020-09-27 ENCOUNTER — Ambulatory Visit
Admission: RE | Admit: 2020-09-27 | Discharge: 2020-09-27 | Disposition: A | Discharge status: Home / Self Care | Payer: BC Managed Care – PPO | Source: Ambulatory Visit | Attending: Internal Medicine | Admitting: Internal Medicine

## 2020-09-27 ENCOUNTER — Other Ambulatory Visit: Payer: Self-pay

## 2020-09-27 DIAGNOSIS — G4459 Other complicated headache syndrome: Secondary | ICD-10-CM

## 2020-09-27 DIAGNOSIS — M542 Cervicalgia: Secondary | ICD-10-CM

## 2020-09-27 DIAGNOSIS — J3489 Other specified disorders of nose and nasal sinuses: Secondary | ICD-10-CM | POA: Diagnosis not present

## 2020-09-27 DIAGNOSIS — G43409 Hemiplegic migraine, not intractable, without status migrainosus: Secondary | ICD-10-CM

## 2020-09-27 DIAGNOSIS — M5412 Radiculopathy, cervical region: Secondary | ICD-10-CM

## 2020-09-27 DIAGNOSIS — M4802 Spinal stenosis, cervical region: Secondary | ICD-10-CM | POA: Diagnosis not present

## 2020-09-27 DIAGNOSIS — R519 Headache, unspecified: Secondary | ICD-10-CM | POA: Diagnosis not present

## 2020-09-27 IMAGING — MR MR HEAD W/O CM
12 series · 48 of 48 positions shown · non-contrast
Comparison: None available.

CLINICAL DATA: Initial evaluation for headaches, hemiplegic
migraines without status migrainosus. Neck pain.

EXAM:
MRI HEAD WITHOUT CONTRAST
TECHNIQUE: Multiplanar, multiecho pulse sequences of the brain and surrounding
structures were obtained without intravenous contrast.

[Series 5: T1 · sagittal · 4.0mm · 0.75mm/px · 3 of 31 slices shown (1 of 2)]
[im 1/31]
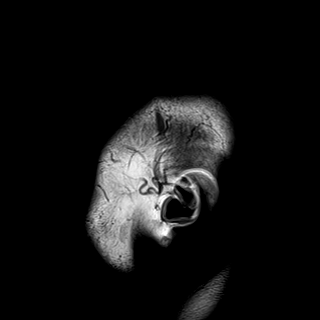
[im 16/31]
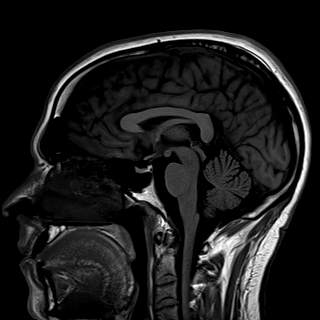
[im 31/31]
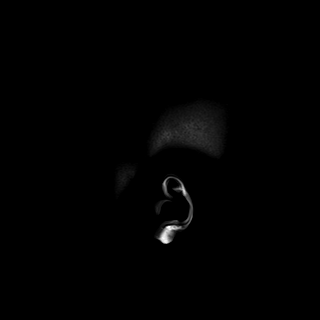

[Series 6: DWI · axial · 3.0mm · 0.94mm/px · z∈[-65,+74]mm · 8 of 160 slices shown (1 of 2)]
[im 1/160]
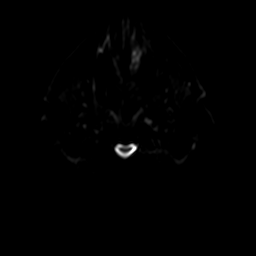
[im 23/160]
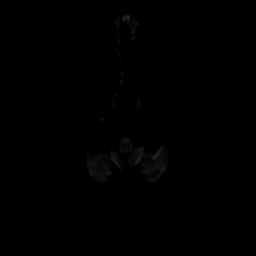
[im 46/160]
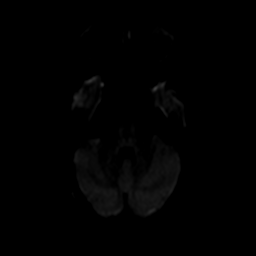
[im 69/160]
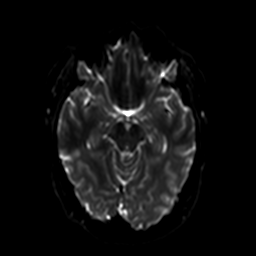
[im 91/160]
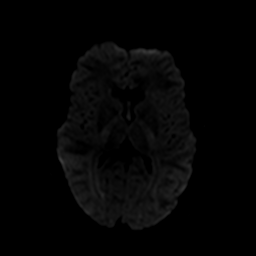
[im 114/160]
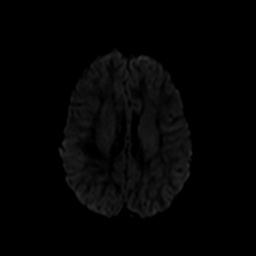
[im 137/160]
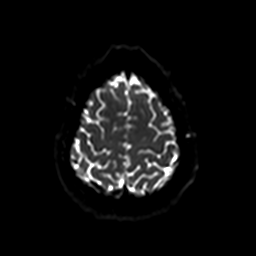
[im 160/160]
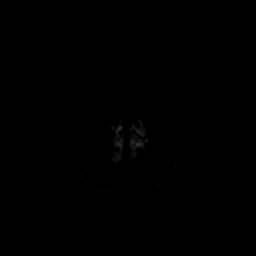

[Series 7: ax dwi_tracew · axial · 3.0mm · 0.94mm/px · z∈[-65,+74]mm · 4 of 80 slices shown]
[im 1/80]
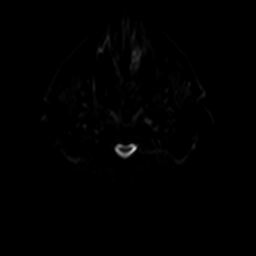
[im 27/80]
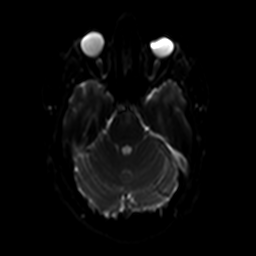
[im 53/80]
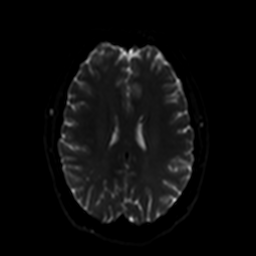
[im 80/80]
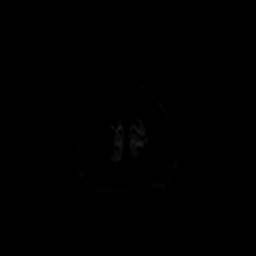

[Series 8: ax dwi_adc · axial · 3.0mm · 0.94mm/px · z∈[-65,+74]mm · 2 of 40 slices shown]
[im 1/40]
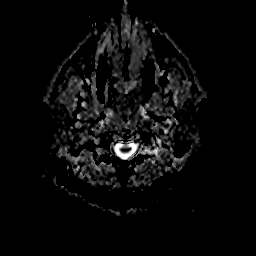
[im 40/40]
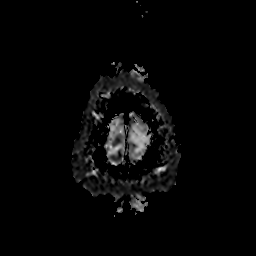

[Series 9: DWI · coronal · 4.0mm · 1.02mm/px · 8 of 148 slices shown (2 of 2)]
[im 1/148]
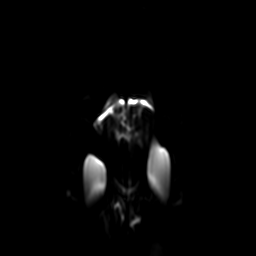
[im 22/148]
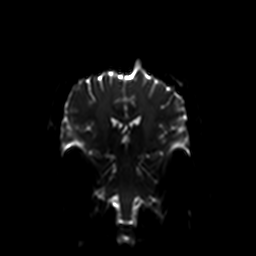
[im 43/148]
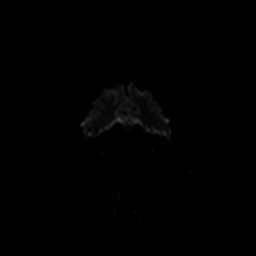
[im 64/148]
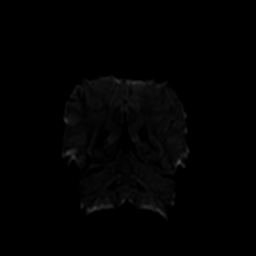
[im 85/148]
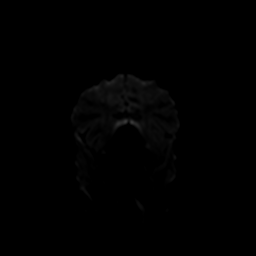
[im 106/148]
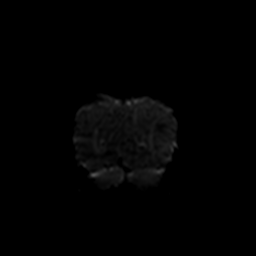
[im 127/148]
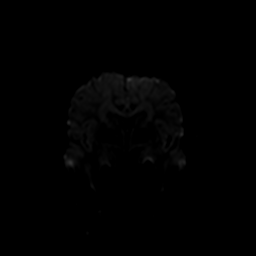
[im 148/148]
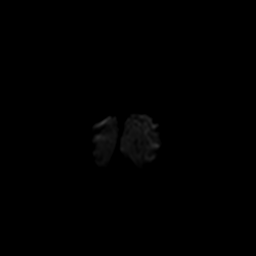

[Series 10: cor dwi_tracew · coronal · 4.0mm · 1.02mm/px · 4 of 75 slices shown]
[im 1/75]
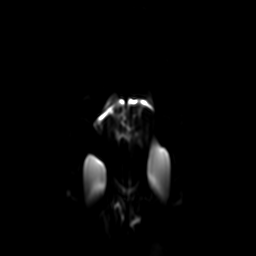
[im 25/75]
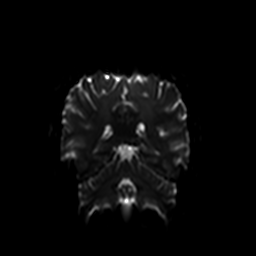
[im 50/75]
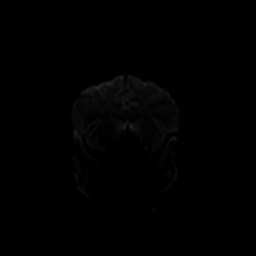
[im 75/75]
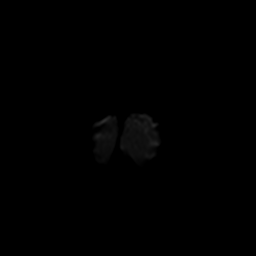

[Series 11: cor dwi_adc · coronal · 4.0mm · 1.02mm/px · 2 of 38 slices shown]
[im 1/38]
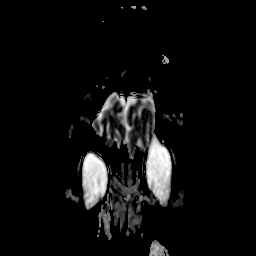
[im 38/38]
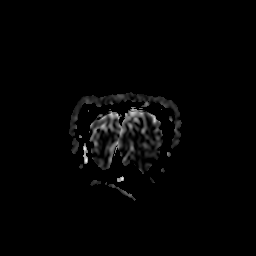

[Series 12: T2 · axial · 4.0mm · 0.36mm/px · 1 of 27 slices shown (1 of 2)]
[im 1/27]
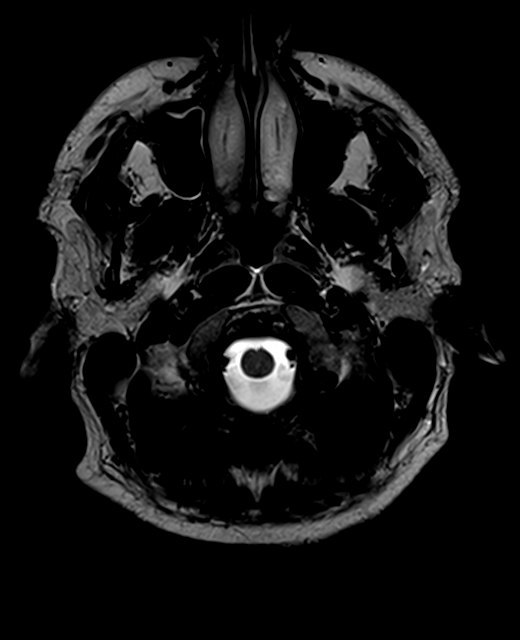

[Series 13: FLAIR · axial · 3.0mm · 0.72mm/px · 1 of 26 slices shown]
[im 1/26]
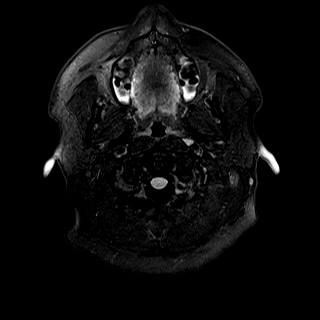

[Series 15: swi_images · axial · 1.5mm · 0.90mm/px · z∈[-72,+70]mm · 5 of 96 slices shown]
[im 1/96]
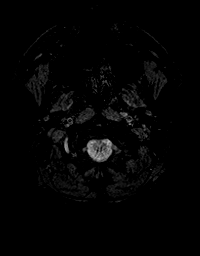
[im 24/96]
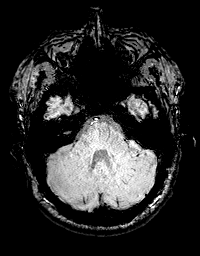
[im 48/96]
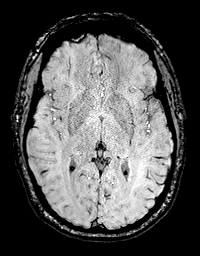
[im 72/96]
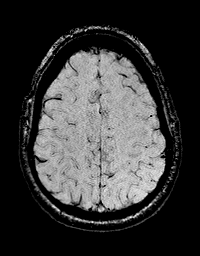
[im 96/96]
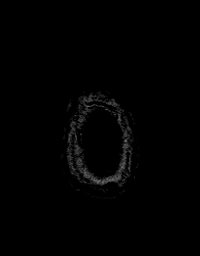

[Series 16: T1 · axial · 1.0mm · 0.94mm/px · z∈[-80,+78]mm · 8 of 160 slices shown (2 of 2)]
[im 1/160]
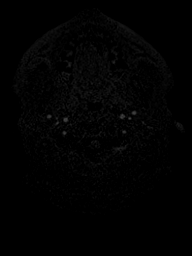
[im 23/160]
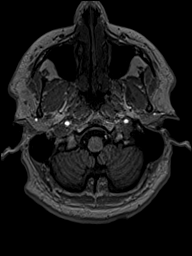
[im 46/160]
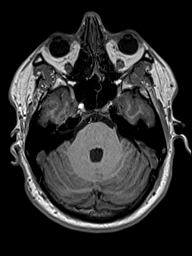
[im 69/160]
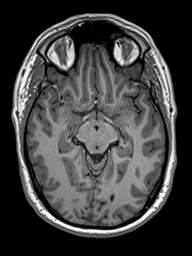
[im 91/160]
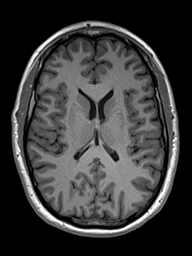
[im 114/160]
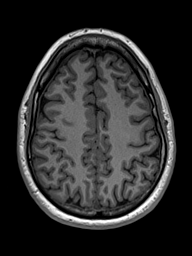
[im 137/160]
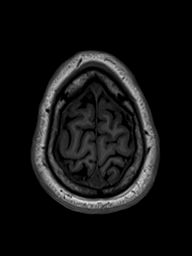
[im 160/160]
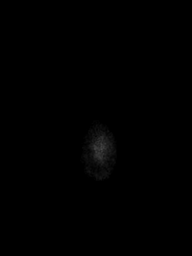

[Series 17: T2 · coronal · 4.5mm · 0.36mm/px · 2 of 30 slices shown (2 of 2)]
[im 1/30]
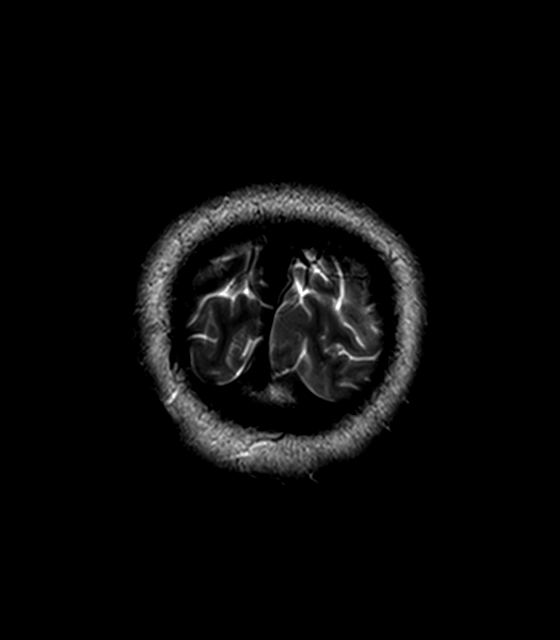
[im 30/30]
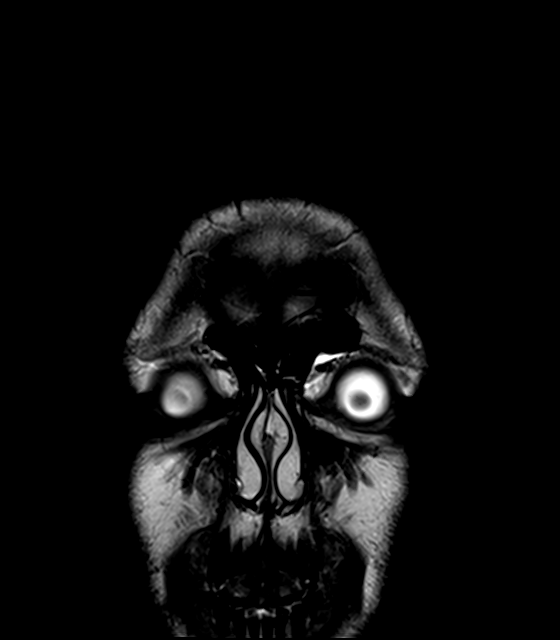

[48 of 48 positions shown; findings below may reference images not displayed]

FINDINGS: Brain: Cerebral volume within normal limits for patient age. No
focal parenchymal signal abnormality identified.

No abnormal foci of restricted diffusion to suggest acute or
subacute ischemia. Gray-white matter differentiation well
maintained. No encephalomalacia to suggest chronic infarction. No
foci of susceptibility artifact to suggest acute or chronic
intracranial hemorrhage.

No mass lesion, midline shift or mass effect. No hydrocephalus. No
extra-axial fluid collection. Major dural sinuses are grossly
patent.

Pituitary gland and suprasellar region are normal. Midline
structures intact and normal.

Vascular: Major intracranial vascular flow voids well maintained and
normal in appearance.

Skull and upper cervical spine: Craniocervical junction normal.
Visualized upper cervical spine within normal limits. Bone marrow
signal intensity normal. No scalp soft tissue abnormality.

Sinuses/Orbits: Globes and orbital soft tissues within normal
limits.

Mild scattered mucosal thickening noted within the ethmoidal air
cells and maxillary sinuses. Paranasal sinuses are otherwise clear.
No significant mastoid effusion. Inner ear structures grossly
normal.

Other: None.
IMPRESSION: Normal MRI of the brain.

## 2020-09-27 IMAGING — MR MR CERVICAL SPINE W/O CM
4 of 5 series · 27 of 48 positions shown · non-contrast
Comparison: None available.

CLINICAL DATA: Initial evaluation for headaches, hemiplegic
migraines without status migrainosus. Neck pain.

EXAM:
MRI CERVICAL SPINE WITHOUT CONTRAST
TECHNIQUE: Multiplanar, multisequence MR imaging of the cervical spine was
performed. No intravenous contrast was administered.

[Series 5: T1 · sagittal · 3.0mm · 0.66mm/px · 7 of 15 slices shown]
[im 1/15]
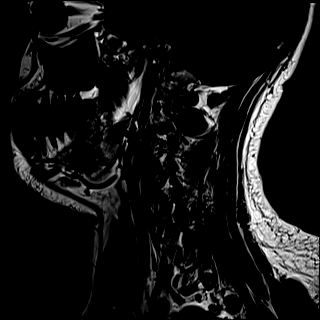
[im 3/15]
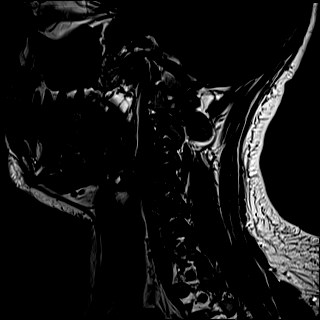
[im 5/15]
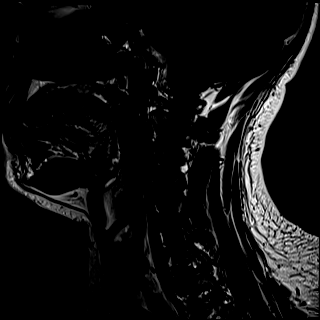
[im 8/15]
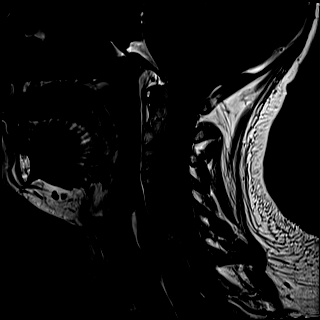
[im 10/15]
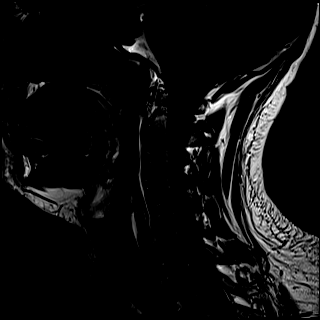
[im 12/15]
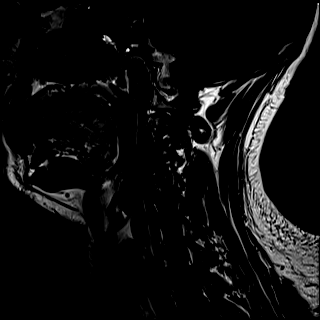
[im 15/15]
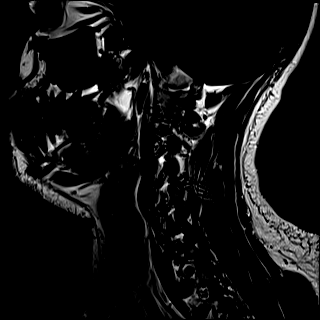

[Series 6: STIR · sagittal · 3.0mm · 0.33mm/px · 6 of 15 slices shown]
[im 1/15]
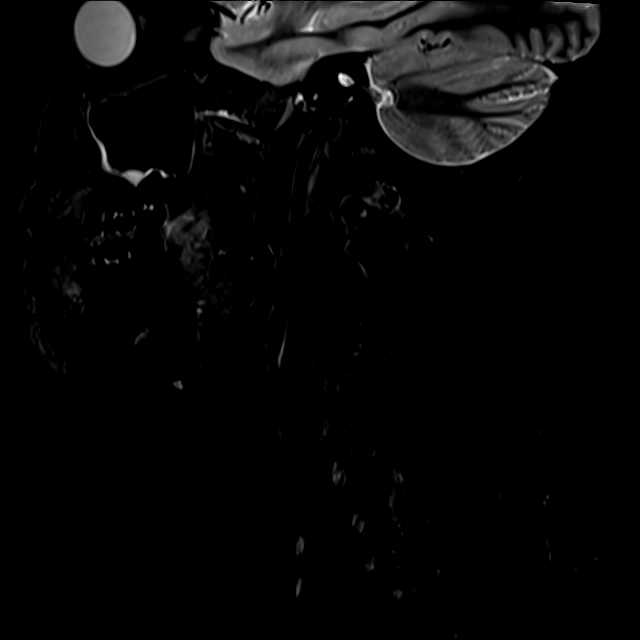
[im 3/15]
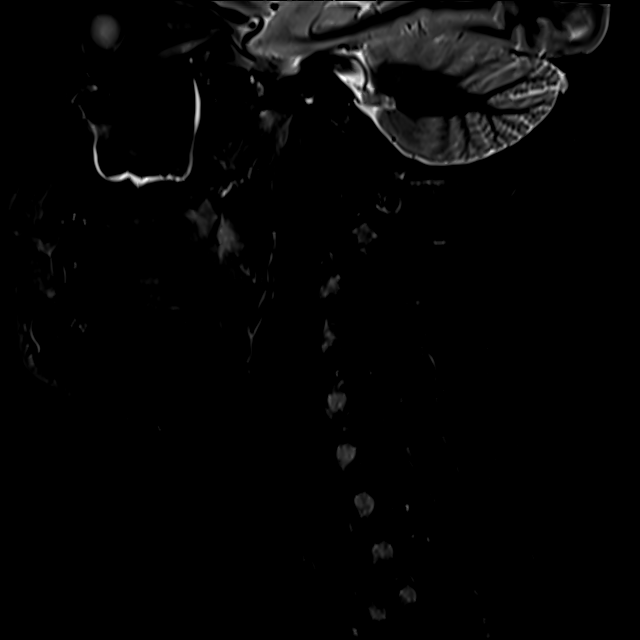
[im 5/15]
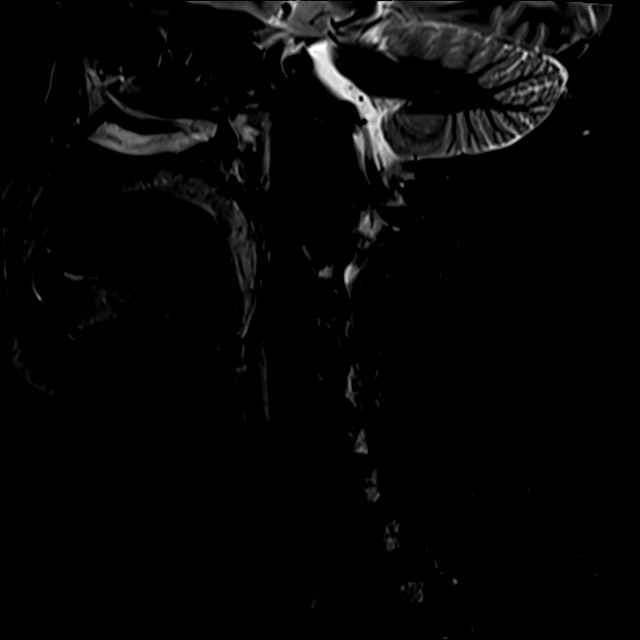
[im 8/15]
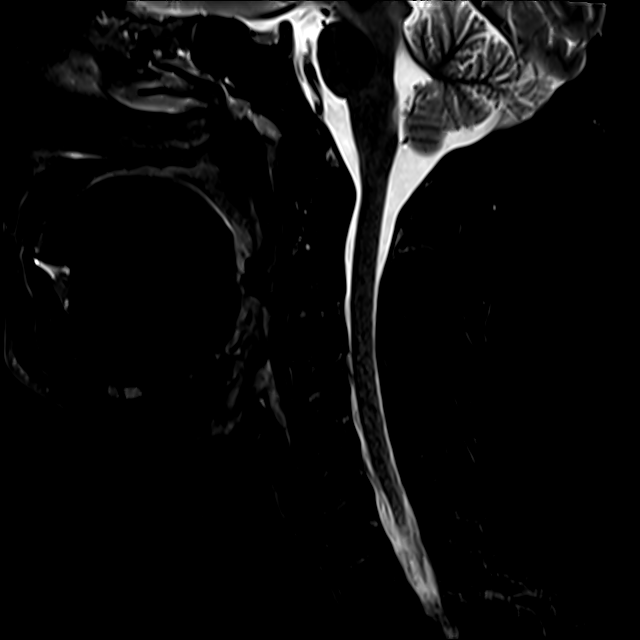
[im 10/15]
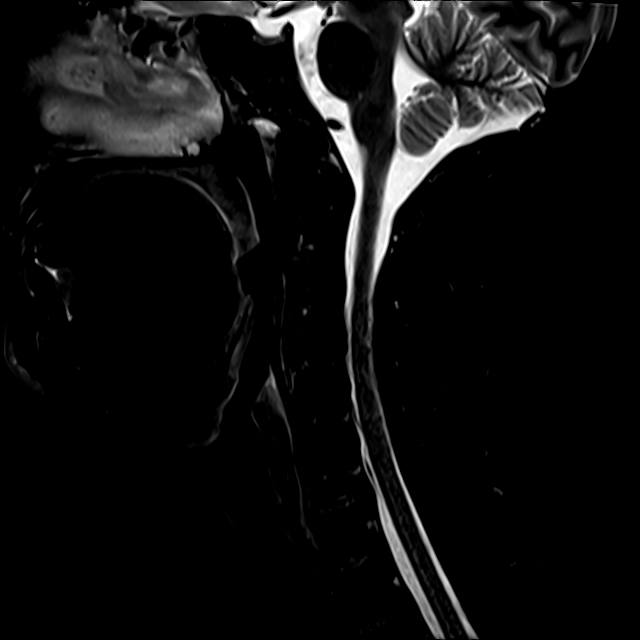
[im 12/15]
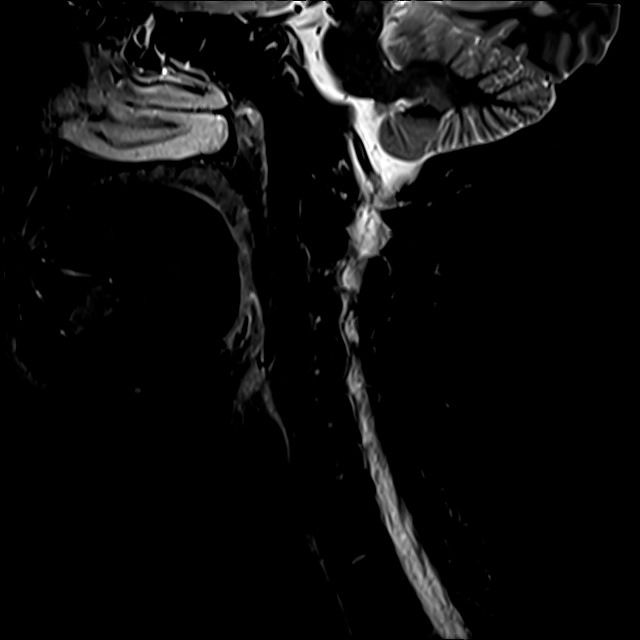

[Series 7: T2 · sagittal · 3.0mm · 0.55mm/px · 6 of 15 slices shown (1 of 2)]
[im 1/15]
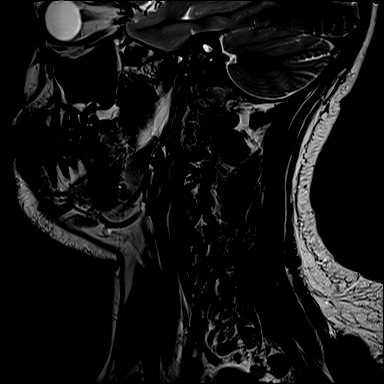
[im 3/15]
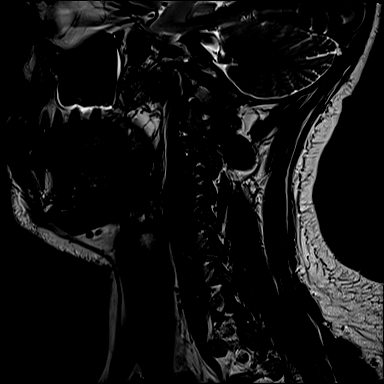
[im 6/15]
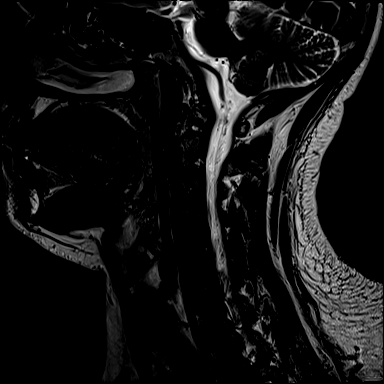
[im 9/15]
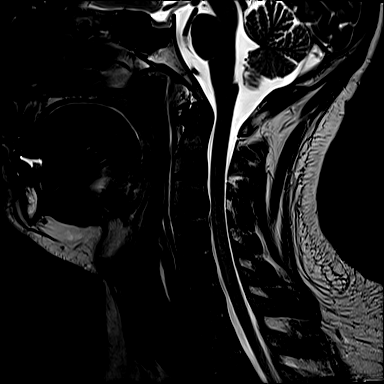
[im 12/15]
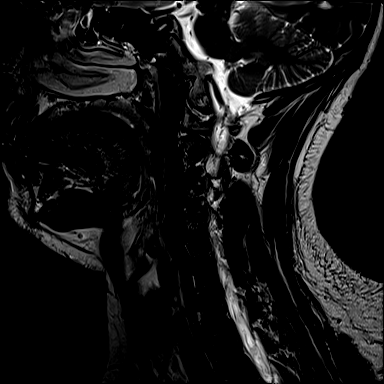
[im 15/15]
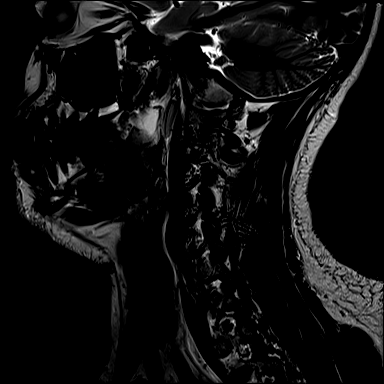

[Series 8: T2 · axial · 3.0mm · 0.50mm/px · z∈[-231,-125]mm · 8 of 34 slices shown (2 of 2)]
[im 1/34]
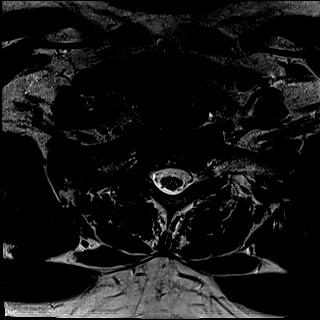
[im 6/34]
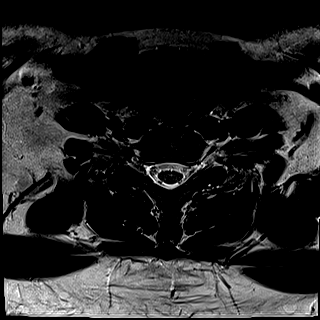
[im 11/34]
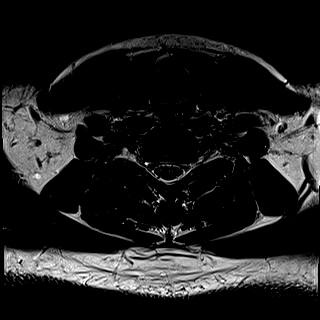
[im 16/34]
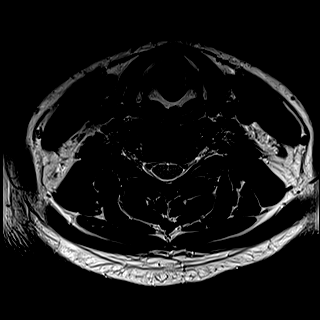
[im 18/34]
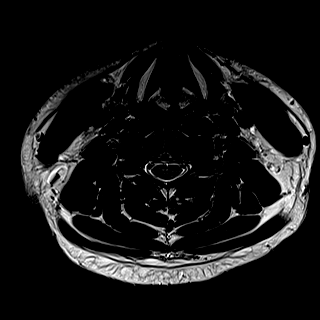
[im 23/34]
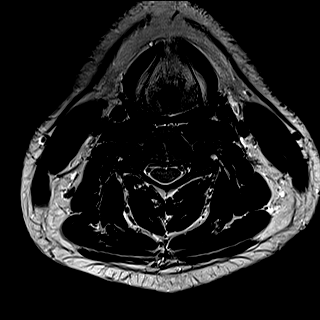
[im 28/34]
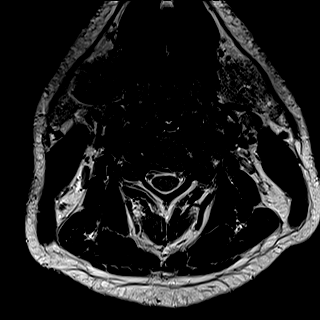
[im 34/34]
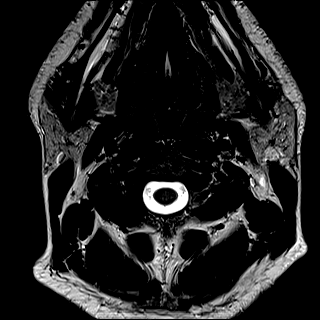

[27 of 48 positions shown; findings below may reference images not displayed]

FINDINGS: Alignment: Mild straightening of the normal cervical lordosis. No
listhesis or static subluxation.

Vertebrae: Vertebral body height well maintained without acute or
chronic fracture. Bone marrow signal intensity within normal limits.
No discrete or worrisome osseous lesions. No abnormal marrow edema.

Cord: Normal signal and morphology.

Posterior Fossa, vertebral arteries, paraspinal tissues:
Craniocervical junction within normal limits. Paraspinous and
prevertebral soft tissues are normal. Normal flow voids seen within
the vertebral arteries bilaterally.

Disc levels:

C2-C3: Unremarkable.

C3-C4: Shallow central disc protrusion mildly indents the ventral
thecal sac, slightly eccentric to the left (series 9, image 10).
Mild spinal stenosis with minimal flattening of the ventral spinal
cord. Foramina remain patent.

C4-C5: Minimal disc bulge with uncovertebral hypertrophy. No spinal
stenosis. Foramina remain patent.

C5-C6: Minimal uncovertebral hypertrophy without significant disc
bulge. No spinal stenosis. Foramina remain patent.

C6-C7: Minimal annular disc bulge and uncovertebral hypertrophy. No
spinal stenosis. Foramina remain patent.

C7-T1: Normal interspace. Minimal facet hypertrophy. No canal or
foraminal stenosis.

Visualized upper thoracic spine demonstrates no significant finding.
IMPRESSION: 1. Shallow central disc protrusion at C3-4 with resultant mild
spinal stenosis and minimal flattening of the ventral spinal cord.
2. Additional minimal noncompressive disc bulging at C4-5 through
C6-7 without stenosis or neural impingement.

## 2020-09-28 ENCOUNTER — Other Ambulatory Visit: Payer: Self-pay | Admitting: Internal Medicine

## 2020-09-28 DIAGNOSIS — M4802 Spinal stenosis, cervical region: Secondary | ICD-10-CM | POA: Insufficient documentation

## 2020-09-28 DIAGNOSIS — M5030 Other cervical disc degeneration, unspecified cervical region: Secondary | ICD-10-CM | POA: Insufficient documentation

## 2020-10-05 DIAGNOSIS — R030 Elevated blood-pressure reading, without diagnosis of hypertension: Secondary | ICD-10-CM | POA: Diagnosis not present

## 2020-10-05 DIAGNOSIS — M542 Cervicalgia: Secondary | ICD-10-CM | POA: Diagnosis not present

## 2020-10-05 DIAGNOSIS — Z6830 Body mass index (BMI) 30.0-30.9, adult: Secondary | ICD-10-CM | POA: Diagnosis not present

## 2020-10-05 DIAGNOSIS — M62838 Other muscle spasm: Secondary | ICD-10-CM | POA: Diagnosis not present

## 2020-12-16 DIAGNOSIS — Z20828 Contact with and (suspected) exposure to other viral communicable diseases: Secondary | ICD-10-CM | POA: Diagnosis not present

## 2020-12-16 DIAGNOSIS — Z20822 Contact with and (suspected) exposure to covid-19: Secondary | ICD-10-CM | POA: Diagnosis not present

## 2021-01-16 ENCOUNTER — Encounter: Payer: BC Managed Care – PPO | Admitting: Internal Medicine

## 2021-02-12 ENCOUNTER — Ambulatory Visit (INDEPENDENT_AMBULATORY_CARE_PROVIDER_SITE_OTHER): Payer: BC Managed Care – PPO | Admitting: Internal Medicine

## 2021-02-12 ENCOUNTER — Encounter: Payer: Self-pay | Admitting: Internal Medicine

## 2021-02-12 ENCOUNTER — Other Ambulatory Visit: Payer: Self-pay

## 2021-02-12 VITALS — BP 124/72 | HR 71 | Temp 98.7°F | Ht 71.0 in | Wt 216.0 lb

## 2021-02-12 DIAGNOSIS — E785 Hyperlipidemia, unspecified: Secondary | ICD-10-CM | POA: Diagnosis not present

## 2021-02-12 DIAGNOSIS — Z0000 Encounter for general adult medical examination without abnormal findings: Secondary | ICD-10-CM | POA: Insufficient documentation

## 2021-02-12 DIAGNOSIS — R7989 Other specified abnormal findings of blood chemistry: Secondary | ICD-10-CM | POA: Diagnosis not present

## 2021-02-12 NOTE — Patient Instructions (Signed)

## 2021-02-12 NOTE — Progress Notes (Signed)
Subjective:  Patient ID: Joshua Boone, male    DOB: 07-17-1991  Age: 30 y.o. MRN: [id number]  CC: Annual Exam  This visit occurred during the SARS-CoV-2 public health emergency.  Safety protocols were in place, including screening questions prior to the visit, additional usage of staff PPE, and extensive cleaning of exam room while observing appropriate contact time as indicated for disinfecting solutions.    HPI Joshua Boone presents for a CPX and f/up -   His neck pain has resolved.  He has felt well recently and offers no complaints.  He has had no more headaches.  He is active and denies any recent episodes of chest pain, shortness of breath, or abdominal pain.  He wants to know his blood type.  Outpatient Medications Prior to Visit  Medication Sig Dispense Refill   calcium carbonate (TUMS - DOSED IN MG ELEMENTAL CALCIUM) 500 MG chewable tablet Chew 1 tablet by mouth as needed for indigestion or heartburn.     Coenzyme Q10 (CO Q 10 PO) Take by mouth.     Fish Oil-Cholecalciferol (OMEGA-3 + D PO) Take by mouth.     Ibuprofen (ADVIL PO) Take by mouth as needed.     Multiple Vitamin (MULTIVITAMIN PO) Take by mouth.     Ubrogepant (UBRELVY) 100 MG TABS Take 1 tablet by mouth daily as needed. (Patient not taking: Reported on 02/12/2021) 2 tablet 0   No facility-administered medications prior to visit.    ROS Review of Systems  Constitutional: Negative.  Negative for diaphoresis and fatigue.  HENT: Negative.    Eyes:  Negative for visual disturbance.  Respiratory:  Negative for cough and shortness of breath.   Cardiovascular:  Negative for chest pain, palpitations and leg swelling.  Gastrointestinal:  Negative for abdominal pain, constipation, diarrhea, nausea and vomiting.  Endocrine: Negative.   Genitourinary: Negative.  Negative for scrotal swelling and testicular pain.  Musculoskeletal: Negative.   Skin: Negative.   Neurological: Negative.   Hematological: Negative.    Psychiatric/Behavioral: Negative.     Objective:  BP 124/72 (BP Location: Right Arm, Patient Position: Sitting, Cuff Size: Large)   Pulse 71   Temp 98.7 F (37.1 C) (Oral)   Ht 5\' 11"  (1.803 m)   Wt 216 lb (98 kg)   SpO2 97%   BMI 30.13 kg/m   BP Readings from Last 3 Encounters:  02/12/21 124/72  08/22/20 126/84  04/17/20 120/82    Wt Readings from Last 3 Encounters:  02/12/21 216 lb (98 kg)  08/22/20 236 lb (107 kg)  04/17/20 212 lb (96.2 kg)    Physical Exam Vitals reviewed.  Constitutional:      Appearance: Normal appearance.  HENT:     Nose: Nose normal.     Mouth/Throat:     Mouth: Mucous membranes are moist.  Eyes:     General: No scleral icterus.    Conjunctiva/sclera: Conjunctivae normal.  Cardiovascular:     Rate and Rhythm: Normal rate and regular rhythm.     Heart sounds: No murmur heard. Pulmonary:     Effort: Pulmonary effort is normal.     Breath sounds: No stridor. No wheezing, rhonchi or rales.  Abdominal:     General: Abdomen is flat.     Palpations: There is no mass.     Tenderness: There is no abdominal tenderness. There is no guarding.     Hernia: No hernia is present. There is no hernia in the left inguinal area or right inguinal  area.  Genitourinary:    Pubic Area: No rash.      Penis: Normal and circumcised.      Testes: Normal.        Right: Mass, tenderness or swelling not present.        Left: Mass, tenderness or swelling not present.     Epididymis:     Right: Normal.     Left: Normal.  Musculoskeletal:        General: Normal range of motion.     Cervical back: Neck supple.     Right lower leg: No edema.     Left lower leg: No edema.  Lymphadenopathy:     Cervical: No cervical adenopathy.     Lower Body: No right inguinal adenopathy. No left inguinal adenopathy.  Skin:    General: Skin is warm and dry.  Neurological:     General: No focal deficit present.     Mental Status: He is alert.    Lab Results  Component  Value Date   WBC 5.1 03/02/2020   HGB 15.0 03/02/2020   HCT 44.1 03/02/2020   PLT 192.0 03/02/2020   GLUCOSE 105 (H) 03/02/2020   CHOL 223 (H) 02/12/2021   TRIG 194.0 (H) 02/12/2021   HDL 35.40 (L) 02/12/2021   LDLDIRECT 173.0 03/02/2020   LDLCALC 149 (H) 02/12/2021   ALT 57 (H) 02/12/2021   AST 21 02/12/2021   NA 139 03/02/2020   K 4.2 03/02/2020   CL 104 03/02/2020   CREATININE 0.93 03/02/2020   BUN 12 03/02/2020   CO2 27 03/02/2020   TSH 0.71 02/12/2021   HGBA1C 5.7 03/02/2020    MR Brain Wo Contrast  Result Date: 09/28/2020 CLINICAL DATA:  Initial evaluation for headaches, 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 Neck pain. EXAM: MRI HEAD WITHOUT CONTRAST TECHNIQUE: Multiplanar, multiecho pulse sequences of the brain and surrounding structures were obtained without intravenous contrast. COMPARISON:  None available. FINDINGS: Brain: Cerebral volume within normal limits for patient age. No focal parenchymal signal abnormality identified. No abnormal foci of restricted diffusion to suggest acute or subacute ischemia. Gray-white matter differentiation well maintained. No encephalomalacia to suggest chronic infarction. No foci of susceptibility artifact to suggest acute or chronic intracranial hemorrhage. No mass lesion, midline shift or mass effect. No hydrocephalus. No extra-axial fluid collection. Major dural sinuses are grossly patent. Pituitary gland and suprasellar region are normal. Midline structures intact and normal. Vascular: Major intracranial vascular flow voids well maintained and normal in appearance. Skull and upper cervical spine: Craniocervical junction normal. Visualized upper cervical spine within normal limits. Bone marrow signal intensity normal. No scalp soft tissue abnormality. Sinuses/Orbits: Globes and orbital soft tissues within normal limits. Mild scattered mucosal thickening noted within the ethmoidal air cells and maxillary sinuses. Paranasal sinuses are  otherwise clear. No significant mastoid effusion. Inner ear structures grossly normal. Other: None. IMPRESSION: Normal MRI of the brain. Electronically Signed   By: Rise Mu M.D.   On: 09/28/2020 01:20   MR Cervical Spine Wo Contrast  Result Date: 09/28/2020 CLINICAL DATA:  Initial evaluation for headaches, 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 Neck pain. EXAM: MRI CERVICAL SPINE WITHOUT CONTRAST TECHNIQUE: Multiplanar, multisequence MR imaging of the cervical spine was performed. No intravenous contrast was administered. COMPARISON:  None available. FINDINGS: Alignment: Mild straightening of the normal cervical lordosis. No listhesis or static subluxation. Vertebrae: Vertebral body height well maintained without acute or chronic fracture. Bone marrow signal intensity within normal limits. No discrete or worrisome osseous lesions.  No abnormal marrow edema. Cord: Normal signal and morphology. Posterior Fossa, vertebral arteries, paraspinal tissues: Craniocervical junction within normal limits. Paraspinous and prevertebral soft tissues are normal. Normal flow voids seen within the vertebral arteries bilaterally. Disc levels: C2-C3: Unremarkable. C3-C4: Shallow central disc protrusion mildly indents the ventral thecal sac, slightly eccentric to the left (series 9, image 10). Mild spinal stenosis with minimal flattening of the ventral spinal cord. Foramina remain patent. C4-C5: Minimal disc bulge with uncovertebral hypertrophy. No spinal stenosis. Foramina remain patent. C5-C6: Minimal uncovertebral hypertrophy without significant disc bulge. No spinal stenosis. Foramina remain patent. C6-C7: Minimal annular disc bulge and uncovertebral hypertrophy. No spinal stenosis. Foramina remain patent. C7-T1: Normal interspace. Minimal facet hypertrophy. No canal or foraminal stenosis. Visualized upper thoracic spine demonstrates no significant finding. IMPRESSION: 1. Shallow central disc protrusion at  C3-4 with resultant mild spinal stenosis and minimal flattening of the ventral spinal cord. 2. Additional minimal noncompressive disc bulging at C4-5 through C6-7 without stenosis or neural impingement. Electronically Signed   By: Rise Mu M.D.   On: 09/28/2020 01:26    Assessment & Plan:   Hill was seen today for annual exam.  Diagnoses and all orders for this visit:  Routine general medical examination at a health care facility- Exam completed, labs reviewed, vaccines are up-to-date, patient education was given. -     Lipid panel; Future -     ABO; Future -     ABO -     Lipid panel  Hyperlipidemia with target LDL less than 160 - Statin therapy is not indicated. -     Hepatic function panel; Future -     TSH; Future -     TSH -     Hepatic function panel  Elevated LFTs- He has a mildly elevated ALT.  His triglycerides are also mildly elevated.  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  I have advised him to improve his lifestyle modifications.  Will recheck his liver enzymes during his next visit.  I am having Seth Bake maintain his Fish Oil-Cholecalciferol (OMEGA-3 + D PO), Multiple Vitamin (MULTIVITAMIN PO), Coenzyme Q10 (CO Q 10 PO), calcium carbonate, Ibuprofen (ADVIL PO), and Ubrelvy.  No orders of the defined types were placed in this encounter.    Follow-up: Return in about 1 year (around 02/12/2022).  Sanda Linger, MD

## 2021-02-13 LAB — HEPATIC FUNCTION PANEL
ALT: 57 U/L — ABNORMAL HIGH (ref 0–53)
AST: 21 U/L (ref 0–37)
Albumin: 4.7 g/dL (ref 3.5–5.2)
Alkaline Phosphatase: 73 U/L (ref 39–117)
Bilirubin, Direct: 0.1 mg/dL (ref 0.0–0.3)
Total Bilirubin: 0.6 mg/dL (ref 0.2–1.2)
Total Protein: 7.2 g/dL (ref 6.0–8.3)

## 2021-02-13 LAB — LIPID PANEL
Cholesterol: 223 mg/dL — ABNORMAL HIGH (ref 0–200)
HDL: 35.4 mg/dL — ABNORMAL LOW (ref >39.00)
LDL Cholesterol: 149 mg/dL — ABNORMAL HIGH (ref 0–99)
NonHDL: 187.92
Total CHOL/HDL Ratio: 6
Triglycerides: 194 mg/dL — ABNORMAL HIGH (ref 0.0–149.0)
VLDL: 38.8 mg/dL (ref 0.0–40.0)

## 2021-02-13 LAB — TSH: TSH: 0.71 u[IU]/mL (ref 0.35–4.50)

## 2021-02-14 LAB — ABO

## 2021-02-15 DIAGNOSIS — R7989 Other specified abnormal findings of blood chemistry: Secondary | ICD-10-CM | POA: Insufficient documentation

## 2021-09-25 DIAGNOSIS — L218 Other seborrheic dermatitis: Secondary | ICD-10-CM | POA: Diagnosis not present

## 2021-09-25 DIAGNOSIS — D2339 Other benign neoplasm of skin of other parts of face: Secondary | ICD-10-CM | POA: Diagnosis not present

## 2021-12-21 DIAGNOSIS — M67961 Unspecified disorder of synovium and tendon, right lower leg: Secondary | ICD-10-CM | POA: Diagnosis not present

## 2021-12-21 DIAGNOSIS — M79671 Pain in right foot: Secondary | ICD-10-CM | POA: Diagnosis not present

## 2021-12-21 DIAGNOSIS — M79672 Pain in left foot: Secondary | ICD-10-CM | POA: Diagnosis not present

## 2021-12-21 DIAGNOSIS — M67962 Unspecified disorder of synovium and tendon, left lower leg: Secondary | ICD-10-CM | POA: Diagnosis not present

## 2022-03-27 DIAGNOSIS — L501 Idiopathic urticaria: Secondary | ICD-10-CM | POA: Diagnosis not present

## 2022-04-25 ENCOUNTER — Encounter: Payer: Self-pay | Admitting: Internal Medicine

## 2022-04-25 ENCOUNTER — Ambulatory Visit (INDEPENDENT_AMBULATORY_CARE_PROVIDER_SITE_OTHER): Payer: BC Managed Care – PPO | Admitting: Internal Medicine

## 2022-04-25 VITALS — BP 128/82 | HR 68 | Temp 98.6°F | Resp 16 | Ht 71.0 in | Wt 210.2 lb

## 2022-04-25 DIAGNOSIS — R7989 Other specified abnormal findings of blood chemistry: Secondary | ICD-10-CM | POA: Diagnosis not present

## 2022-04-25 DIAGNOSIS — Z0000 Encounter for general adult medical examination without abnormal findings: Secondary | ICD-10-CM

## 2022-04-25 NOTE — Progress Notes (Unsigned)
Subjective:  Patient ID: Joshua Boone, male    DOB: November 19, 1990  Age: 31 y.o. MRN: [id number]  CC: Annual Exam   HPI Joshua Boone presents for a CPX and f/up -  Outpatient Medications Prior to Visit  Medication Sig Dispense Refill   calcium carbonate (TUMS - DOSED IN MG ELEMENTAL CALCIUM) 500 MG chewable tablet Chew 1 tablet by mouth as needed for indigestion or heartburn.     Coenzyme Q10 (CO Q 10 PO) Take by mouth.     Fish Oil-Cholecalciferol (OMEGA-3 + D PO) Take by mouth.     Ibuprofen (ADVIL PO) Take by mouth as needed.     Multiple Vitamin (MULTIVITAMIN PO) Take by mouth.     Ubrogepant (UBRELVY) 100 MG TABS Take 1 tablet by mouth daily as needed. 2 tablet 0   No facility-administered medications prior to visit.    ROS Review of Systems  Objective:  BP 128/82 (BP Location: Left Arm, Patient Position: Sitting, Cuff Size: Normal)   Pulse 68   Temp 98.6 F (37 C) (Oral)   Resp 16   Ht 5\' 11"  (1.803 m)   Wt 210 lb 4 oz (95.4 kg)   SpO2 96%   BMI 29.32 kg/m   BP Readings from Last 3 Encounters:  04/25/22 128/82  02/12/21 124/72  08/22/20 126/84    Wt Readings from Last 3 Encounters:  04/25/22 210 lb 4 oz (95.4 kg)  02/12/21 216 lb (98 kg)  08/22/20 236 lb (107 kg)    Physical Exam  Lab Results  Component Value Date   WBC 5.1 03/02/2020   HGB 15.0 03/02/2020   HCT 44.1 03/02/2020   PLT 192.0 03/02/2020   GLUCOSE 105 (H) 03/02/2020   CHOL 205 (H) 04/26/2022   TRIG 136.0 04/26/2022   HDL 32.50 (L) 04/26/2022   LDLDIRECT 173.0 03/02/2020   LDLCALC 145 (H) 04/26/2022   ALT 46 04/26/2022   AST 19 04/26/2022   NA 139 03/02/2020   K 4.2 03/02/2020   CL 104 03/02/2020   CREATININE 0.93 03/02/2020   BUN 12 03/02/2020   CO2 27 03/02/2020   TSH 0.71 02/12/2021   HGBA1C 5.7 03/02/2020    MR Cervical Spine Wo Contrast  Result Date: 09/28/2020 CLINICAL DATA:  Initial evaluation for headaches, 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 Neck pain. EXAM:  MRI CERVICAL SPINE WITHOUT CONTRAST TECHNIQUE: Multiplanar, multisequence MR imaging of the cervical spine was performed. No intravenous contrast was administered. COMPARISON:  None available. FINDINGS: Alignment: Mild straightening of the normal cervical lordosis. No listhesis or static subluxation. Vertebrae: Vertebral body height well maintained without acute or chronic fracture. Bone marrow signal intensity within normal limits. No discrete or worrisome osseous lesions. No abnormal marrow edema. Cord: Normal signal and morphology. Posterior Fossa, vertebral arteries, paraspinal tissues: Craniocervical junction within normal limits. Paraspinous and prevertebral soft tissues are normal. Normal flow voids seen within the vertebral arteries bilaterally. Disc levels: C2-C3: Unremarkable. C3-C4: Shallow central disc protrusion mildly indents the ventral thecal sac, slightly eccentric to the left (series 9, image 10). Mild spinal stenosis with minimal flattening of the ventral spinal cord. Foramina remain patent. C4-C5: Minimal disc bulge with uncovertebral hypertrophy. No spinal stenosis. Foramina remain patent. C5-C6: Minimal uncovertebral hypertrophy without significant disc bulge. No spinal stenosis. Foramina remain patent. C6-C7: Minimal annular disc bulge and uncovertebral hypertrophy. No spinal stenosis. Foramina remain patent. C7-T1: Normal interspace. Minimal facet hypertrophy. No canal or foraminal stenosis. Visualized upper thoracic spine demonstrates no significant finding. IMPRESSION:  1. Shallow central disc protrusion at C3-4 with resultant mild spinal stenosis and minimal flattening of the ventral spinal cord. 2. Additional minimal noncompressive disc bulging at C4-5 through C6-7 without stenosis or neural impingement. Electronically Signed   By: Rise Mu M.D.   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 Neck pain. EXAM: MRI HEAD WITHOUT CONTRAST TECHNIQUE: Multiplanar, multiecho pulse sequences of the brain and surrounding structures were obtained without intravenous contrast. COMPARISON:  None available. FINDINGS: Brain: Cerebral volume within normal limits for patient age. No focal parenchymal signal abnormality identified. No abnormal foci of restricted diffusion to suggest acute or subacute ischemia. Gray-white matter differentiation well maintained. No encephalomalacia to suggest chronic infarction. No foci of susceptibility artifact to suggest acute or chronic intracranial hemorrhage. No mass lesion, midline shift or mass effect. No hydrocephalus. No extra-axial fluid collection. Major dural sinuses are grossly patent. Pituitary gland and suprasellar region are normal. Midline structures intact and normal. Vascular: Major intracranial vascular flow voids well maintained and normal in appearance. Skull and upper cervical spine: Craniocervical junction normal. Visualized upper cervical spine within normal limits. Bone marrow signal intensity normal. No scalp soft tissue abnormality. Sinuses/Orbits: Globes and orbital soft tissues within normal limits. Mild scattered mucosal thickening noted within the ethmoidal air cells and maxillary sinuses. Paranasal sinuses are otherwise clear. No significant mastoid effusion. Inner ear structures grossly normal. Other: None. IMPRESSION: Normal MRI of the brain. Electronically Signed   By: Rise Mu M.D.   On: 09/28/2020 01:20    Assessment & Plan:   Joshua Boone was seen today for annual exam.  Diagnoses and all orders for this visit:  Elevated LFTs -     Hepatic function panel; Future -     Hepatic function panel  Routine general medical examination at a health care facility -     Lipid panel; Future -     Lipid panel   I am having Joshua Boone maintain his Fish Oil-Cholecalciferol (OMEGA-3 + D PO),  Multiple Vitamin (MULTIVITAMIN PO), Coenzyme Q10 (CO Q 10 PO), calcium carbonate, Ibuprofen (ADVIL PO), and Ubrelvy.  No orders of the defined types were placed in this encounter.    Follow-up: No follow-ups on file.  Sanda Linger, MD

## 2022-04-26 LAB — LIPID PANEL
Cholesterol: 205 mg/dL — ABNORMAL HIGH (ref 0–200)
HDL: 32.5 mg/dL — ABNORMAL LOW (ref >39.00)
LDL Cholesterol: 145 mg/dL — ABNORMAL HIGH (ref 0–99)
NonHDL: 172.51
Total CHOL/HDL Ratio: 6
Triglycerides: 136 mg/dL (ref 0.0–149.0)
VLDL: 27.2 mg/dL (ref 0.0–40.0)

## 2022-04-26 LAB — HEPATIC FUNCTION PANEL
ALT: 46 U/L (ref 0–53)
AST: 19 U/L (ref 0–37)
Albumin: 4.7 g/dL (ref 3.5–5.2)
Alkaline Phosphatase: 70 U/L (ref 39–117)
Bilirubin, Direct: 0.1 mg/dL (ref 0.0–0.3)
Total Bilirubin: 0.7 mg/dL (ref 0.2–1.2)
Total Protein: 7.2 g/dL (ref 6.0–8.3)

## 2023-01-09 DIAGNOSIS — D2239 Melanocytic nevi of other parts of face: Secondary | ICD-10-CM | POA: Diagnosis not present

## 2023-01-09 DIAGNOSIS — L218 Other seborrheic dermatitis: Secondary | ICD-10-CM | POA: Diagnosis not present

## 2023-01-09 DIAGNOSIS — D2262 Melanocytic nevi of left upper limb, including shoulder: Secondary | ICD-10-CM | POA: Diagnosis not present

## 2023-06-30 ENCOUNTER — Encounter: Payer: BC Managed Care – PPO | Admitting: Internal Medicine

## 2023-07-16 ENCOUNTER — Encounter: Payer: Self-pay | Admitting: Internal Medicine

## 2023-07-16 ENCOUNTER — Ambulatory Visit: Payer: BC Managed Care – PPO | Admitting: Internal Medicine

## 2023-07-16 VITALS — BP 122/80 | HR 77 | Temp 98.5°F | Resp 16 | Ht 71.0 in | Wt 202.0 lb

## 2023-07-16 DIAGNOSIS — Z800 Family history of malignant neoplasm of digestive organs: Secondary | ICD-10-CM | POA: Insufficient documentation

## 2023-07-16 DIAGNOSIS — E785 Hyperlipidemia, unspecified: Secondary | ICD-10-CM | POA: Diagnosis not present

## 2023-07-16 DIAGNOSIS — R7989 Other specified abnormal findings of blood chemistry: Secondary | ICD-10-CM | POA: Diagnosis not present

## 2023-07-16 DIAGNOSIS — Z0000 Encounter for general adult medical examination without abnormal findings: Secondary | ICD-10-CM | POA: Diagnosis not present

## 2023-07-16 LAB — BASIC METABOLIC PANEL
BUN: 11 mg/dL (ref 6–23)
CO2: 29 meq/L (ref 19–32)
Calcium: 9.7 mg/dL (ref 8.4–10.5)
Chloride: 102 meq/L (ref 96–112)
Creatinine, Ser: 1.05 mg/dL (ref 0.40–1.50)
GFR: 93.11 mL/min (ref >60.00)
Glucose, Bld: 98 mg/dL (ref 70–99)
Potassium: 4.2 meq/L (ref 3.5–5.1)
Sodium: 139 meq/L (ref 135–145)

## 2023-07-16 LAB — CBC WITH DIFFERENTIAL/PLATELET
Basophils Absolute: 0 10*3/uL (ref 0.0–0.1)
Basophils Relative: 0.5 % (ref 0.0–3.0)
Eosinophils Absolute: 0.1 10*3/uL (ref 0.0–0.7)
Eosinophils Relative: 1.8 % (ref 0.0–5.0)
HCT: 47.5 % (ref 39.0–52.0)
Hemoglobin: 15.6 g/dL (ref 13.0–17.0)
Lymphocytes Relative: 39 % (ref 12.0–46.0)
Lymphs Abs: 2.1 10*3/uL (ref 0.7–4.0)
MCHC: 32.8 g/dL (ref 30.0–36.0)
MCV: 88 fL (ref 78.0–100.0)
Monocytes Absolute: 0.5 10*3/uL (ref 0.1–1.0)
Monocytes Relative: 9.7 % (ref 3.0–12.0)
Neutro Abs: 2.7 10*3/uL (ref 1.4–7.7)
Neutrophils Relative %: 49 % (ref 43.0–77.0)
Platelets: 200 10*3/uL (ref 150.0–400.0)
RBC: 5.39 Mil/uL (ref 4.22–5.81)
RDW: 12.6 % (ref 11.5–15.5)
WBC: 5.4 10*3/uL (ref 4.0–10.5)

## 2023-07-16 LAB — HEPATIC FUNCTION PANEL
ALT: 37 U/L (ref 0–53)
AST: 18 U/L (ref 0–37)
Albumin: 4.8 g/dL (ref 3.5–5.2)
Alkaline Phosphatase: 63 U/L (ref 39–117)
Bilirubin, Direct: 0.1 mg/dL (ref 0.0–0.3)
Total Bilirubin: 0.7 mg/dL (ref 0.2–1.2)
Total Protein: 7.5 g/dL (ref 6.0–8.3)

## 2023-07-16 LAB — LIPID PANEL
Cholesterol: 236 mg/dL — ABNORMAL HIGH (ref 0–200)
HDL: 35.2 mg/dL — ABNORMAL LOW (ref >39.00)
LDL Cholesterol: 174 mg/dL — ABNORMAL HIGH (ref 0–99)
NonHDL: 200.43
Total CHOL/HDL Ratio: 7
Triglycerides: 132 mg/dL (ref 0.0–149.0)
VLDL: 26.4 mg/dL (ref 0.0–40.0)

## 2023-07-16 NOTE — Patient Instructions (Signed)
Health Maintenance, Male Adopting a healthy lifestyle and getting preventive care are important in promoting health and wellness. Ask your health care provider about: The right schedule for you to have regular tests and exams. Things you can do on your own to prevent diseases and keep yourself healthy. What should I know about diet, weight, and exercise? Eat a healthy diet  Eat a diet that includes plenty of vegetables, fruits, low-fat dairy products, and lean protein. Do not eat a lot of foods that are high in solid fats, added sugars, or sodium. Maintain a healthy weight Body mass index (BMI) is a measurement that can be used to identify possible weight problems. It estimates body fat based on height and weight. Your health care provider can help determine your BMI and help you achieve or maintain a healthy weight. Get regular exercise Get regular exercise. This is one of the most important things you can do for your health. Most adults should: Exercise for at least 150 minutes each week. The exercise should increase your heart rate and make you sweat (moderate-intensity exercise). Do strengthening exercises at least twice a week. This is in addition to the moderate-intensity exercise. Spend less time sitting. Even light physical activity can be beneficial. Watch cholesterol and blood lipids Have your blood tested for lipids and cholesterol at 33 years of age, then have this test every 5 years. You may need to have your cholesterol levels checked more often if: Your lipid or cholesterol levels are high. You are older than 33 years of age. You are at high risk for heart disease. What should I know about cancer screening? Many types of cancers can be detected early and may often be prevented. Depending on your health history and family history, you may need to have cancer screening at various ages. This may include screening for: Colorectal cancer. Prostate cancer. Skin cancer. Lung  cancer. What should I know about heart disease, diabetes, and high blood pressure? Blood pressure and heart disease High blood pressure causes heart disease and increases the risk of stroke. This is more likely to develop in people who have high blood pressure readings or are overweight. Talk with your health care provider about your target blood pressure readings. Have your blood pressure checked: Every 3-5 years if you are 18-39 years of age. Every year if you are 40 years old or older. If you are between the ages of 65 and 75 and are a current or former smoker, ask your health care provider if you should have a one-time screening for abdominal aortic aneurysm (AAA). Diabetes Have regular diabetes screenings. This checks your fasting blood sugar level. Have the screening done: Once every three years after age 45 if you are at a normal weight and have a low risk for diabetes. More often and at a younger age if you are overweight or have a high risk for diabetes. What should I know about preventing infection? Hepatitis B If you have a higher risk for hepatitis B, you should be screened for this virus. Talk with your health care provider to find out if you are at risk for hepatitis B infection. Hepatitis C Blood testing is recommended for: Everyone born from 1945 through 1965. Anyone with known risk factors for hepatitis C. Sexually transmitted infections (STIs) You should be screened each year for STIs, including gonorrhea and chlamydia, if: You are sexually active and are younger than 33 years of age. You are older than 33 years of age and your   health care provider tells you that you are at risk for this type of infection. Your sexual activity has changed since you were last screened, and you are at increased risk for chlamydia or gonorrhea. Ask your health care provider if you are at risk. Ask your health care provider about whether you are at high risk for HIV. Your health care provider  may recommend a prescription medicine to help prevent HIV infection. If you choose to take medicine to prevent HIV, you should first get tested for HIV. You should then be tested every 3 months for as long as you are taking the medicine. Follow these instructions at home: Alcohol use Do not drink alcohol if your health care provider tells you not to drink. If you drink alcohol: Limit how much you have to 0-2 drinks a day. Know how much alcohol is in your drink. In the [place] one drink equals one 12 oz bottle of beer (355 mL), one 5 oz glass of wine (148 mL), or one 1 oz glass of hard liquor (44 mL). Lifestyle Do not use any products that contain nicotine or tobacco. These products include cigarettes, chewing tobacco, and vaping devices, such as e-cigarettes. If you need help quitting, ask your health care provider. Do not use street drugs. Do not share needles. Ask your health care provider for help if you need support or information about quitting drugs. General instructions Schedule regular health, dental, and eye exams. Stay current with your vaccines. Tell your health care provider if: You often feel depressed. You have ever been abused or do not feel safe at home. Summary Adopting a healthy lifestyle and getting preventive care are important in promoting health and wellness. Follow your health care provider's instructions about healthy diet, exercising, and getting tested or screened for diseases. Follow your health care provider's instructions on monitoring your cholesterol and blood pressure. This information is not intended to replace advice given to you by your health care provider. Make sure you discuss any questions you have with your health care provider. Document Revised: 01/15/2021 Document Reviewed: 01/15/2021 Elsevier Patient Education  2024 [employer]  

## 2023-07-16 NOTE — Progress Notes (Signed)
Subjective:  Patient ID: Abdelaziz Westenberger, male    DOB: 05-15-1991  Age: 32 y.o. MRN: [id number]  CC: Annual Exam and Hyperlipidemia   HPI Osten Janek presents for a CPX and f/up ---   Discussed the use of AI scribe software for clinical note transcription with the patient, who gave verbal consent to proceed.  History of Present Illness   The patient, an active individual with a young child, presents for a routine physical examination. He reports feeling well overall, with no complaints of chest pain, shortness of breath, or drastic changes in weight. He has been losing some weight, which he attributes to increased exercise and dietary modifications.  The patient has a family history of early onset hearing loss in a sibling and colorectal cancer in his father. He denies any personal hearing issues and expresses curiosity about when he should have his first colonoscopy due to his family history.  He has no trouble breathing, no swollen lymph nodes, and no night sweats.       Outpatient Medications Prior to Visit  Medication Sig Dispense Refill   calcium carbonate (TUMS - DOSED IN MG ELEMENTAL CALCIUM) 500 MG chewable tablet Chew 1 tablet by mouth as needed for indigestion or heartburn.     Coenzyme Q10 (CO Q 10 PO) Take by mouth.     Fish Oil-Cholecalciferol (OMEGA-3 + D PO) Take by mouth.     Ibuprofen (ADVIL PO) Take by mouth as needed.     Multiple Vitamin (MULTIVITAMIN PO) Take by mouth.     Ubrogepant (UBRELVY) 100 MG TABS Take 1 tablet by mouth daily as needed. 2 tablet 0   No facility-administered medications prior to visit.    ROS Review of Systems  Constitutional: Negative.  Negative for diaphoresis, fatigue and unexpected weight change.  HENT: Negative.    Eyes: Negative.   Respiratory: Negative.  Negative for cough, chest tightness and wheezing.   Cardiovascular:  Negative for chest pain, palpitations and leg swelling.  Gastrointestinal: Negative.  Negative for abdominal  pain, constipation, diarrhea and vomiting.  Genitourinary: Negative.  Negative for difficulty urinating, dysuria, scrotal swelling, testicular pain and urgency.  Musculoskeletal: Negative.   Skin: Negative.   Neurological:  Negative for dizziness and weakness.  Hematological:  Negative for adenopathy. Does not bruise/bleed easily.  Psychiatric/Behavioral: Negative.      Objective:  BP 122/80   Pulse 77   Temp 98.5 F (36.9 C) (Oral)   Resp 16   Ht 5\' 11"  (1.803 m)   Wt 202 lb (91.6 kg)   SpO2 99%   BMI 28.17 kg/m   BP Readings from Last 3 Encounters:  07/16/23 122/80  04/25/22 128/82  02/12/21 124/72    Wt Readings from Last 3 Encounters:  07/16/23 202 lb (91.6 kg)  04/25/22 210 lb 4 oz (95.4 kg)  02/12/21 216 lb (98 kg)    Physical Exam Vitals reviewed.  Constitutional:      Appearance: Normal appearance.  HENT:     Nose: Nose normal.     Mouth/Throat:     Mouth: Mucous membranes are moist.  Eyes:     General: No scleral icterus.    Conjunctiva/sclera: Conjunctivae normal.  Cardiovascular:     Rate and Rhythm: Normal rate and regular rhythm.     Heart sounds: No murmur heard. Pulmonary:     Effort: Pulmonary effort is normal.     Breath sounds: No stridor. No wheezing, rhonchi or rales.  Abdominal:  General: Abdomen is flat.     Palpations: There is no mass.     Tenderness: There is no abdominal tenderness. There is no guarding.     Hernia: No hernia is present.  Musculoskeletal:        General: Normal range of motion.     Cervical back: Neck supple.     Right lower leg: No edema.     Left lower leg: No edema.  Skin:    General: Skin is warm and dry.     Findings: No rash.  Neurological:     General: No focal deficit present.     Mental Status: He is alert. Mental status is at baseline.  Psychiatric:        Mood and Affect: Mood normal.        Behavior: Behavior normal.     Lab Results  Component Value Date   WBC 5.4 07/16/2023   HGB 15.6  07/16/2023   HCT 47.5 07/16/2023   PLT 200.0 07/16/2023   GLUCOSE 98 07/16/2023   CHOL 236 (H) 07/16/2023   TRIG 132.0 07/16/2023   HDL 35.20 (L) 07/16/2023   LDLDIRECT 173.0 03/02/2020   LDLCALC 174 (H) 07/16/2023   ALT 37 07/16/2023   AST 18 07/16/2023   NA 139 07/16/2023   K 4.2 07/16/2023   CL 102 07/16/2023   CREATININE 1.05 07/16/2023   BUN 11 07/16/2023   CO2 29 07/16/2023   TSH 0.71 02/12/2021   HGBA1C 5.7 03/02/2020    MR Cervical Spine Wo Contrast  Result Date: 09/28/2020 CLINICAL DATA:  Initial evaluation for headaches, 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 Neck pain. EXAM: MRI CERVICAL SPINE WITHOUT CONTRAST TECHNIQUE: Multiplanar, multisequence MR imaging of the cervical spine was performed. No intravenous contrast was administered. COMPARISON:  None available. FINDINGS: Alignment: Mild straightening of the normal cervical lordosis. No listhesis or static subluxation. Vertebrae: Vertebral body height well maintained without acute or chronic fracture. Bone marrow signal intensity within normal limits. No discrete or worrisome osseous lesions. No abnormal marrow edema. Cord: Normal signal and morphology. Posterior Fossa, vertebral arteries, paraspinal tissues: Craniocervical junction within normal limits. Paraspinous and prevertebral soft tissues are normal. Normal flow voids seen within the vertebral arteries bilaterally. Disc levels: C2-C3: Unremarkable. C3-C4: Shallow central disc protrusion mildly indents the ventral thecal sac, slightly eccentric to the left (series 9, image 10). Mild spinal stenosis with minimal flattening of the ventral spinal cord. Foramina remain patent. C4-C5: Minimal disc bulge with uncovertebral hypertrophy. No spinal stenosis. Foramina remain patent. C5-C6: Minimal uncovertebral hypertrophy without significant disc bulge. No spinal stenosis. Foramina remain patent. C6-C7: Minimal annular disc bulge and uncovertebral hypertrophy. No spinal  stenosis. Foramina remain patent. C7-T1: Normal interspace. Minimal facet hypertrophy. No canal or foraminal stenosis. Visualized upper thoracic spine demonstrates no significant finding. IMPRESSION: 1. Shallow central disc protrusion at C3-4 with resultant mild spinal stenosis and minimal flattening of the ventral spinal cord. 2. Additional minimal noncompressive disc bulging at C4-5 through C6-7 without stenosis or neural impingement. Electronically Signed   By: Rise Mu M.D.   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 Neck pain. EXAM: MRI HEAD WITHOUT CONTRAST TECHNIQUE: Multiplanar, multiecho pulse sequences of the brain and surrounding structures were obtained without intravenous contrast. COMPARISON:  None available. FINDINGS: Brain: Cerebral volume within normal limits for patient age. No focal parenchymal signal abnormality identified. No abnormal foci  of restricted diffusion to suggest acute or subacute ischemia. Gray-white matter differentiation well maintained. No encephalomalacia to suggest chronic infarction. No foci of susceptibility artifact to suggest acute or chronic intracranial hemorrhage. No mass lesion, midline shift or mass effect. No hydrocephalus. No extra-axial fluid collection. Major dural sinuses are grossly patent. Pituitary gland and suprasellar region are normal. Midline structures intact and normal. Vascular: Major intracranial vascular flow voids well maintained and normal in appearance. Skull and upper cervical spine: Craniocervical junction normal. Visualized upper cervical spine within normal limits. Bone marrow signal intensity normal. No scalp soft tissue abnormality. Sinuses/Orbits: Globes and orbital soft tissues within normal limits. Mild scattered mucosal thickening noted within the ethmoidal air cells and maxillary sinuses. Paranasal  sinuses are otherwise clear. No significant mastoid effusion. Inner ear structures grossly normal. Other: None. IMPRESSION: Normal MRI of the brain. Electronically Signed   By: Rise Mu M.D.   On: 09/28/2020 01:20    Assessment & Plan:   Elevated LFTs- His LFT's have improved. -     Hepatic function panel; Future -     Basic metabolic panel; Future -     CBC with Differential/Platelet; Future  Hyperlipidemia with target LDL less than 160- Statin is not indicated. -     Lipid panel; Future -     Hepatic function panel; Future  Routine general medical examination at a health care facility- Exam completed, labs reviewed, vaccines reviewed, no cancer screenings indicated, pt ed material was given.   Family history of colon cancer in father -     Ambulatory referral to Gastroenterology     Follow-up: Return in about 1 year (around 07/15/2024).  Sanda Linger, MD

## 2023-07-29 ENCOUNTER — Encounter: Payer: Self-pay | Admitting: Physician Assistant

## 2023-10-30 ENCOUNTER — Ambulatory Visit: Payer: BC Managed Care – PPO | Admitting: Physician Assistant

## 2023-10-30 ENCOUNTER — Encounter: Payer: Self-pay | Admitting: Physician Assistant

## 2023-10-30 VITALS — BP 118/86 | HR 76 | Ht 71.0 in | Wt <= 1120 oz

## 2023-10-30 DIAGNOSIS — R7989 Other specified abnormal findings of blood chemistry: Secondary | ICD-10-CM | POA: Diagnosis not present

## 2023-10-30 DIAGNOSIS — Z800 Family history of malignant neoplasm of digestive organs: Secondary | ICD-10-CM | POA: Diagnosis not present

## 2023-10-30 NOTE — Progress Notes (Signed)
 10/30/2023 Joshua Boone [id number] 03/31/1991  Referring provider: Etta Grandchild, MD Primary GI doctor: Dr. Barron Alvine  ASSESSMENT AND PLAN:  Family history of colon cancer Father with colon cancer age 34, passed at  Paternal 2nd cousin with colon cancer age 90 He has 2 older brothers with MI, and older brother age 38, unknown colonoscopy history Has BM daily, every other day, no hematochezia No anemia -Schedule colonoscopy at age 51, recall placed -Advise patient to inquire about older brother's colonoscopy findings.   -call sooner if any alarming symptoms, anemia, or if brother has history of advanced polyps  Elevated LFTs 2 years ago LFT 56 correlated with weight gain due to lack of kitchen due to renovations and new child He has lost weight and this has been normal Denies family history of ETOH, no family history of liver disease Previous negative hep C -Continue weight loss and healthy diet.   -Monitor liver function tests with primary care provider.  Consider imaging/work up if elevations reoccrurs    Patient Care Team: Etta Grandchild, MD as PCP - General (Internal Medicine)  HISTORY OF PRESENT ILLNESS: 33 y.o. male with a past medical history of hyperlipidemia, migraines, family history of colon cancer in father 70 and others listed below presents due to family history of colon cancer.  Patient's father had colon cancer at age 54. Patient had slight ALT elevation of 57 two years ago last 2 liver functions have been unremarkable, hepatitis C antibody neg 2021  Discussed the use of AI scribe software for clinical note transcription with the patient, who gave verbal consent to proceed.  History of Present Illness   The patient, with a strong family history of colon cancer, presents to establish care and discuss colon cancer screening. He reports no personal history of colon issues. His father was diagnosed with colon cancer at 20 and passed away at 29. Additionally,  his father's cousin was diagnosed with colon cancer around 60. The patient has two older brothers, one of whom passed away from a heart attack in 2020 after multiple kidney transplants and other health issues. His other brother is 34, and the patient is unsure if he has had a colonoscopy.  The patient reports no symptoms, having regular daily bowel movements without constipation or other issues. He has annual physicals with blood work, which have not shown any issues except for high cholesterol. He admits to not having the best diet but has started making changes. He has lost some weight after a period of weight gain due to lifestyle changes, including having a baby and renovating his kitchen.  The patient also mentions a liver function elevation two years ago, which he attributes to the weight gain and poor diet during that period. He reports social alcohol use, about one drink per week, and no family history of liver disease. He denies any abdominal pain, nausea, or vomiting.     He  reports that he has never smoked. He has never used smokeless tobacco. He reports current alcohol use. He reports that he does not currently use drugs.  RELEVANT GI HISTORY, LABS, IMAGING:  CBC    Component Value Date/Time   WBC 5.4 07/16/2023 0848   RBC 5.39 07/16/2023 0848   HGB 15.6 07/16/2023 0848   HCT 47.5 07/16/2023 0848   PLT 200.0 07/16/2023 0848   MCV 88.0 07/16/2023 0848   MCHC 32.8 07/16/2023 0848   RDW 12.6 07/16/2023 0848   LYMPHSABS 2.1 07/16/2023 0848  MONOABS 0.5 07/16/2023 0848   EOSABS 0.1 07/16/2023 0848   BASOSABS 0.0 07/16/2023 0848   Recent Labs    07/16/23 0848  HGB 15.6    CMP     Component Value Date/Time   NA 139 07/16/2023 0848   K 4.2 07/16/2023 0848   CL 102 07/16/2023 0848   CO2 29 07/16/2023 0848   GLUCOSE 98 07/16/2023 0848   BUN 11 07/16/2023 0848   CREATININE 1.05 07/16/2023 0848   CALCIUM 9.7 07/16/2023 0848   PROT 7.5 07/16/2023 0848   ALBUMIN 4.8  07/16/2023 0848   AST 18 07/16/2023 0848   ALT 37 07/16/2023 0848   ALKPHOS 63 07/16/2023 0848   BILITOT 0.7 07/16/2023 0848      Latest Ref Rng & Units 07/16/2023    8:48 AM 04/26/2022    8:07 AM 02/12/2021    4:08 PM  Hepatic Function  Total Protein 6.0 - 8.3 g/dL 7.5  7.2  7.2   Albumin 3.5 - 5.2 g/dL 4.8  4.7  4.7   AST 0 - 37 U/L 18  19  21    ALT 0 - 53 U/L 37  46  57   Alk Phosphatase 39 - 117 U/L 63  70  73   Total Bilirubin 0.2 - 1.2 mg/dL 0.7  0.7  0.6   Bilirubin, Direct 0.0 - 0.3 mg/dL 0.1  0.1  0.1       Current Medications:      Current Outpatient Medications (Analgesics):    Ibuprofen (ADVIL PO), Take by mouth as needed.   Ubrogepant (UBRELVY) 100 MG TABS, Take 1 tablet by mouth daily as needed. (Patient not taking: Reported on 10/30/2023)   Current Outpatient Medications (Other):    calcium carbonate (TUMS - DOSED IN MG ELEMENTAL CALCIUM) 500 MG chewable tablet, Chew 1 tablet by mouth as needed for indigestion or heartburn.   Coenzyme Q10 (CO Q 10 PO), Take by mouth.   Fish Oil-Cholecalciferol (OMEGA-3 + D PO), Take by mouth.   Multiple Vitamin (MULTIVITAMIN PO), Take by mouth.  Medical History:  Past Medical History:  Diagnosis Date   Frequent headaches    GERD (gastroesophageal reflux disease)    Hyperlipidemia    Allergies: No Known Allergies   Surgical History:  He  has a past surgical history that includes Wisdom tooth extraction. Family History:  His family history includes Alzheimer's disease in his paternal grandfather; Birth defects in his brother; Colon cancer (age of onset: 44) in his father; Diabetes in his brother; Early death in his brother; Hearing loss in his brother; Learning disabilities in his brother.  REVIEW OF SYSTEMS  : All other systems reviewed and negative except where noted in the History of Present Illness.  PHYSICAL EXAM: BP 118/86   Pulse 76   Ht 5\' 11"  (1.803 m)   Wt 20 lb (9.072 kg)   BMI 2.79 kg/m  General  Appearance: Well nourished, in no apparent distress. Head:   Normocephalic and atraumatic. Eyes:  sclerae anicteric,conjunctive pink  Respiratory: Respiratory effort normal, BS equal bilaterally without rales, rhonchi, wheezing. Cardio: RRR with no MRGs. Peripheral pulses intact.  Abdomen: Soft,  Non-distended ,active bowel sounds. No tenderness .Marland Kitchen No masses. Rectal: Not evaluated Musculoskeletal: Full ROM, Normal gait. Without edema. Skin:  Dry and intact without significant lesions or rashes Neuro: Alert and  oriented x4;  No focal deficits. Psych:  Cooperative. Normal mood and affect.    Doree Albee, PA-C 2:14 PM

## 2023-10-30 NOTE — Patient Instructions (Addendum)
 Call your brother and find out about his colon.  Call our office if any changes in bowel habits, blood in stool, or concerns.  Put in recall colon for age 33.   _______________________________________________________  If your blood pressure at your visit was 140/90 or greater, please contact your primary care physician to follow up on this.  _______________________________________________________  If you are age 58 or older, your body mass index should be between 23-30. Your Body mass index is 2.79 kg/m. If this is out of the aforementioned range listed, please consider follow up with your Primary Care Provider.  If you are age 29 or younger, your body mass index should be between 19-25. Your Body mass index is 2.79 kg/m. If this is out of the aformentioned range listed, please consider follow up with your Primary Care Provider.   ________________________________________________________  The [facility] providers would like to encourage you to use [facility] to communicate with providers for non-urgent requests or questions.  Due to long hold times on the telephone, sending your provider a message by [facility] may be a faster and more efficient way to get a response.  Please allow 48 business hours for a response.  Please remember that this is for non-urgent requests.  _______________________________________________________  Thank you for trusting me with your gastrointestinal care!   Quentin Mulling, PA

## 2023-11-14 NOTE — Progress Notes (Signed)
 Agree with the assessment and plan as outlined by Quentin Mulling, PA-C.  Based on that family history, per guidelines, would not be due for colonoscopy until age 33.  With that said, if any lower GI symptoms, changes in family history, or additional history after discussion with his brother, can certainly contact us to plan for colonoscopy earlier than age 79.  Chasitee Zenker, DO, [facility]

## 2024-02-04 ENCOUNTER — Encounter: Payer: Self-pay | Admitting: Internal Medicine

## 2024-02-05 ENCOUNTER — Other Ambulatory Visit: Payer: Self-pay | Admitting: Internal Medicine

## 2024-02-05 DIAGNOSIS — D229 Melanocytic nevi, unspecified: Secondary | ICD-10-CM | POA: Insufficient documentation

## 2024-02-11 ENCOUNTER — Other Ambulatory Visit: Payer: Self-pay | Admitting: Internal Medicine

## 2024-02-11 DIAGNOSIS — M5030 Other cervical disc degeneration, unspecified cervical region: Secondary | ICD-10-CM

## 2024-02-11 DIAGNOSIS — M4802 Spinal stenosis, cervical region: Secondary | ICD-10-CM

## 2024-02-11 MED ORDER — TIZANIDINE HCL 2 MG PO TABS: 2.0000 mg | ORAL_TABLET | Freq: Three times a day (TID) | ORAL | 0 refills | Status: AC | PRN

## 2024-07-29 ENCOUNTER — Ambulatory Visit: Admitting: Internal Medicine

## 2024-07-29 ENCOUNTER — Encounter: Payer: Self-pay | Admitting: Internal Medicine

## 2024-07-29 VITALS — BP 126/74 | HR 72 | Temp 98.9°F | Ht 71.0 in | Wt 196.8 lb

## 2024-07-29 DIAGNOSIS — Z0001 Encounter for general adult medical examination with abnormal findings: Secondary | ICD-10-CM | POA: Insufficient documentation

## 2024-07-29 DIAGNOSIS — R7989 Other specified abnormal findings of blood chemistry: Secondary | ICD-10-CM

## 2024-07-29 DIAGNOSIS — E785 Hyperlipidemia, unspecified: Secondary | ICD-10-CM | POA: Diagnosis not present

## 2024-07-29 DIAGNOSIS — M5030 Other cervical disc degeneration, unspecified cervical region: Secondary | ICD-10-CM | POA: Diagnosis not present

## 2024-07-29 DIAGNOSIS — Z0000 Encounter for general adult medical examination without abnormal findings: Secondary | ICD-10-CM | POA: Diagnosis not present

## 2024-07-29 LAB — LIPID PANEL
Cholesterol: 238 mg/dL — ABNORMAL HIGH (ref 0–200)
HDL: 41.6 mg/dL (ref >39.00)
LDL Cholesterol: 170 mg/dL — ABNORMAL HIGH (ref 0–99)
NonHDL: 196.74
Total CHOL/HDL Ratio: 6
Triglycerides: 136 mg/dL (ref 0.0–149.0)
VLDL: 27.2 mg/dL (ref 0.0–40.0)

## 2024-07-29 LAB — HEPATIC FUNCTION PANEL
ALT: 29 U/L (ref 0–53)
AST: 15 U/L (ref 0–37)
Albumin: 5.1 g/dL (ref 3.5–5.2)
Alkaline Phosphatase: 63 U/L (ref 39–117)
Bilirubin, Direct: 0.1 mg/dL (ref 0.0–0.3)
Total Bilirubin: 0.8 mg/dL (ref 0.2–1.2)
Total Protein: 7.8 g/dL (ref 6.0–8.3)

## 2024-07-29 LAB — TSH: TSH: 0.79 u[IU]/mL (ref 0.35–5.50)

## 2024-07-29 NOTE — Progress Notes (Signed)
 Subjective:  Patient ID: Joshua Boone, male    DOB: [birth year]  Age: 33 y.o. MRN: [id number]  CC: Annual Exam and Hyperlipidemia   HPI [name] presents for a CPX and f/up ---  Discussed the use of AI scribe software for clinical note transcription with the patient, who gave verbal consent to proceed.  History of Present Illness Joshua Boone is a 33 year old male who presents for a routine follow-up visit.  He feels the best he has in a long time, attributing this to increased physical activity and an improved diet. He has intentionally lost a significant amount of weight by avoiding unhealthy foods and becoming more active. No unexpected weight loss.  He has a long-standing issue with neck and upper back pain, which he manages with occasional use of muscle relaxers, particularly when he overexerts himself. No radiation of pain to his legs, numbness, weakness, or tingling.  He no longer takes Tums as he hasn't experienced any issues requiring them. He also stopped using Ubrelvy  for headaches, as he believes his headaches are related to seasonal sinus issues. He has seen an allergist who identified allergies to the local environment, which he manages by avoiding triggers.  No chest pain or shortness of breath during physical activity. He also reports no testicular pain or swelling.     Outpatient Medications Prior to Visit  Medication Sig Dispense Refill   Coenzyme Q10 (CO Q 10 PO) Take by mouth.     Fish Oil-Cholecalciferol (OMEGA-3 + D PO) Take by mouth.     Ibuprofen (ADVIL PO) Take by mouth as needed.     Multiple Vitamin (MULTIVITAMIN PO) Take by mouth.     tiZANidine  (ZANAFLEX ) 2 MG tablet Take 1 tablet (2 mg total) by mouth every 8 (eight) hours as needed for muscle spasms. 30 tablet 0   calcium carbonate (TUMS - DOSED IN MG ELEMENTAL CALCIUM) 500 MG chewable tablet Chew 1 tablet by mouth as needed for indigestion or heartburn.     Ubrogepant  (UBRELVY ) 100 MG TABS Take 1 tablet  by mouth daily as needed. (Patient not taking: Reported on 10/30/2023) 2 tablet 0   No facility-administered medications prior to visit.    ROS Review of Systems  Constitutional:  Negative for appetite change, chills, diaphoresis, fatigue and fever.  HENT: Negative.    Eyes: Negative.  Negative for visual disturbance.  Respiratory: Negative.  Negative for chest tightness, shortness of breath and wheezing.   Cardiovascular:  Negative for chest pain, palpitations and leg swelling.  Gastrointestinal: Negative.  Negative for abdominal pain, constipation, diarrhea, nausea and vomiting.  Genitourinary: Negative.  Negative for difficulty urinating, penile pain, penile swelling and scrotal swelling.  Musculoskeletal:  Positive for back pain and neck pain. Negative for joint swelling, myalgias and neck stiffness.  Skin: Negative.   Neurological:  Negative for dizziness, weakness and numbness.  Hematological:  Negative for adenopathy. Does not bruise/bleed easily.  Psychiatric/Behavioral: Negative.      Objective:  BP 126/74 (BP Location: Left Arm, Patient Position: Sitting, Cuff Size: Normal)   Pulse 72   Temp 98.9 F (37.2 C) (Oral)   Ht 5' 11 (1.803 m)   Wt 196 lb 12.8 oz (89.3 kg)   SpO2 97%   BMI 27.45 kg/m   BP Readings from Last 3 Encounters:  07/29/24 126/74  10/30/23 118/86  07/16/23 122/80    Wt Readings from Last 3 Encounters:  07/29/24 196 lb 12.8 oz (89.3 kg)  10/30/23 20 lb (  9.072 kg)  07/16/23 202 lb (91.6 kg)    Physical Exam Vitals reviewed.  Constitutional:      Appearance: Normal appearance.  HENT:     Nose: Nose normal.     Mouth/Throat:     Mouth: Mucous membranes are moist.  Eyes:     General: No scleral icterus.    Conjunctiva/sclera: Conjunctivae normal.  Cardiovascular:     Rate and Rhythm: Normal rate and regular rhythm.     Heart sounds: No murmur heard.    No friction rub. No gallop.  Pulmonary:     Effort: Pulmonary effort is normal.      Breath sounds: No stridor. No wheezing, rhonchi or rales.  Abdominal:     General: Abdomen is flat.     Palpations: There is no mass.     Tenderness: There is no abdominal tenderness. There is no guarding.     Hernia: No hernia is present.  Musculoskeletal:        General: Normal range of motion.     Cervical back: Neck supple.     Right lower leg: No edema.     Left lower leg: No edema.  Lymphadenopathy:     Cervical: No cervical adenopathy.  Skin:    General: Skin is warm and dry.     Findings: No rash.  Neurological:     General: No focal deficit present.     Mental Status: He is alert.  Psychiatric:        Mood and Affect: Mood normal.        Behavior: Behavior normal.     Lab Results  Component Value Date   WBC 5.4 07/16/2023   HGB 15.6 07/16/2023   HCT 47.5 07/16/2023   PLT 200.0 07/16/2023   GLUCOSE 98 07/16/2023   CHOL 238 (H) 07/29/2024   TRIG 136.0 07/29/2024   HDL 41.60 07/29/2024   LDLDIRECT 173.0 03/02/2020   LDLCALC 170 (H) 07/29/2024   ALT 29 07/29/2024   AST 15 07/29/2024   NA 139 07/16/2023   K 4.2 07/16/2023   CL 102 07/16/2023   CREATININE 1.05 07/16/2023   BUN 11 07/16/2023   CO2 29 07/16/2023   TSH 0.79 07/29/2024   HGBA1C 5.7 03/02/2020    MR Cervical Spine Wo Contrast Result Date: 09/28/2020 CLINICAL DATA:  Initial evaluation for headaches, 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 Neck pain. EXAM: MRI CERVICAL SPINE WITHOUT CONTRAST TECHNIQUE: Multiplanar, multisequence MR imaging of the cervical spine was performed. No intravenous contrast was administered. COMPARISON:  None available. FINDINGS: Alignment: Mild straightening of the normal cervical lordosis. No listhesis or static subluxation. Vertebrae: Vertebral body height well maintained without acute or chronic fracture. Bone marrow signal intensity within normal limits. No discrete or worrisome osseous lesions. No abnormal marrow edema. Cord: Normal signal and morphology.  Posterior Fossa, vertebral arteries, paraspinal tissues: Craniocervical junction within normal limits. Paraspinous and prevertebral soft tissues are normal. Normal flow voids seen within the vertebral arteries bilaterally. Disc levels: C2-C3: Unremarkable. C3-C4: Shallow central disc protrusion mildly indents the ventral thecal sac, slightly eccentric to the left (series 9, image 10). Mild spinal stenosis with minimal flattening of the ventral spinal cord. Foramina remain patent. C4-C5: Minimal disc bulge with uncovertebral hypertrophy. No spinal stenosis. Foramina remain patent. C5-C6: Minimal uncovertebral hypertrophy without significant disc bulge. No spinal stenosis. Foramina remain patent. C6-C7: Minimal annular disc bulge and uncovertebral hypertrophy. No spinal stenosis. Foramina remain patent. C7-T1: Normal interspace. Minimal facet hypertrophy. No canal or  foraminal stenosis. Visualized upper thoracic spine demonstrates no significant finding. IMPRESSION: 1. Shallow central disc protrusion at C3-4 with resultant mild spinal stenosis and minimal flattening of the ventral spinal cord. 2. Additional minimal noncompressive disc bulging at C4-5 through C6-7 without stenosis or neural impingement. Electronically Signed   By: Morene Hoard M.D.   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 Neck pain. EXAM: MRI HEAD WITHOUT CONTRAST TECHNIQUE: Multiplanar, multiecho pulse sequences of the brain and surrounding structures were obtained without intravenous contrast. COMPARISON:  None available. FINDINGS: Brain: Cerebral volume within normal limits for patient age. No focal parenchymal signal abnormality identified. No abnormal foci of restricted diffusion to suggest acute or subacute ischemia. Gray-white matter differentiation well maintained. No encephalomalacia to suggest chronic  infarction. No foci of susceptibility artifact to suggest acute or chronic intracranial hemorrhage. No mass lesion, midline shift or mass effect. No hydrocephalus. No extra-axial fluid collection. Major dural sinuses are grossly patent. Pituitary gland and suprasellar region are normal. Midline structures intact and normal. Vascular: Major intracranial vascular flow voids well maintained and normal in appearance. Skull and upper cervical spine: Craniocervical junction normal. Visualized upper cervical spine within normal limits. Bone marrow signal intensity normal. No scalp soft tissue abnormality. Sinuses/Orbits: Globes and orbital soft tissues within normal limits. Mild scattered mucosal thickening noted within the ethmoidal air cells and maxillary sinuses. Paranasal sinuses are otherwise clear. No significant mastoid effusion. Inner ear structures grossly normal. Other: None. IMPRESSION: Normal MRI of the brain. Electronically Signed   By: Morene Hoard M.D.   On: 09/28/2020 01:20    The ASCVD Risk score (Arnett DK, et al., 2019) failed to calculate for the following reasons:   The 2019 ASCVD risk score is only valid for ages 52 to 11   Fibrosis 4 Score = .47 Score is based on outdated labs. ALT, AST, and platelets should all be measured within the last 6 months for an accurate FIB-4 Score  Fib-4 interpretation is not validated for people under 35 or over 67 years of age. However, scores under 2.0 are generally considered low risk.   Assessment & Plan:   Hyperlipidemia with target LDL less than 160- Statin is not indicated. -     Lipid panel; Future -     TSH; Future -     Hepatic function panel; Future  Elevated LFTs -     Hepatitis B surface antibody,quantitative; Future -     Hepatitis B surface antigen; Future -     Hepatitis A antibody, total; Future  Encounter for general adult medical examination with abnormal findings- Exam completed, labs reviewed, vaccines reviewed, no  cancer screenings indicated, pt ed material was given.  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     Follow-up: Return in about 1 year (around 07/29/2025).  Debby Molt, MD

## 2024-07-29 NOTE — Patient Instructions (Signed)
 Health Maintenance, Male  Adopting a healthy lifestyle and getting preventive care are important in promoting health and wellness. Ask your health care provider about:  The right schedule for you to have regular tests and exams.  Things you can do on your own to prevent diseases and keep yourself healthy.  What should I know about diet, weight, and exercise?  Eat a healthy diet    Eat a diet that includes plenty of vegetables, fruits, low-fat dairy products, and lean protein.  Do not eat a lot of foods that are high in solid fats, added sugars, or sodium.  Maintain a healthy weight  Body mass index (BMI) is a measurement that can be used to identify possible weight problems. It estimates body fat based on height and weight. Your health care provider can help determine your BMI and help you achieve or maintain a healthy weight.  Get regular exercise  Get regular exercise. This is one of the most important things you can do for your health. Most adults should:  Exercise for at least 150 minutes each week. The exercise should increase your heart rate and make you sweat (moderate-intensity exercise).  Do strengthening exercises at least twice a week. This is in addition to the moderate-intensity exercise.  Spend less time sitting. Even light physical activity can be beneficial.  Watch cholesterol and blood lipids  Have your blood tested for lipids and cholesterol at 34 years of age, then have this test every 5 years.  You may need to have your cholesterol levels checked more often if:  Your lipid or cholesterol levels are high.  You are older than 35 years of age.  You are at high risk for heart disease.  What should I know about cancer screening?  Many types of cancers can be detected early and may often be prevented. Depending on your health history and family history, you may need to have cancer screening at various ages. This may include screening for:  Colorectal cancer.  Prostate cancer.  Skin cancer.  Lung  cancer.  What should I know about heart disease, diabetes, and high blood pressure?  Blood pressure and heart disease  High blood pressure causes heart disease and increases the risk of stroke. This is more likely to develop in people who have high blood pressure readings or are overweight.  Talk with your health care provider about your target blood pressure readings.  Have your blood pressure checked:  Every 3-5 years if you are 24-52 years of age.  Every year if you are 3 years old or older.  If you are between the ages of 60 and 72 and are a current or former smoker, ask your health care provider if you should have a one-time screening for abdominal aortic aneurysm (AAA).  Diabetes  Have regular diabetes screenings. This checks your fasting blood sugar level. Have the screening done:  Once every three years after age 66 if you are at a normal weight and have a low risk for diabetes.  More often and at a younger age if you are overweight or have a high risk for diabetes.  What should I know about preventing infection?  Hepatitis B  If you have a higher risk for hepatitis B, you should be screened for this virus. Talk with your health care provider to find out if you are at risk for hepatitis B infection.  Hepatitis C  Blood testing is recommended for:  Everyone born from [birth year] through 1966.  Anyone  with known risk factors for hepatitis C.  Sexually transmitted infections (STIs)  You should be screened each year for STIs, including gonorrhea and chlamydia, if:  You are sexually active and are younger than 34 years of age.  You are older than 34 years of age and your health care provider tells you that you are at risk for this type of infection.  Your sexual activity has changed since you were last screened, and you are at increased risk for chlamydia or gonorrhea. Ask your health care provider if you are at risk.  Ask your health care provider about whether you are at high risk for HIV. Your health care provider  may recommend a prescription medicine to help prevent HIV infection. If you choose to take medicine to prevent HIV, you should first get tested for HIV. You should then be tested every 3 months for as long as you are taking the medicine.  Follow these instructions at home:  Alcohol use  Do not drink alcohol if your health care provider tells you not to drink.  If you drink alcohol:  Limit how much you have to 0-2 drinks a day.  Know how much alcohol is in your drink. In the [place] one drink equals one 12 oz bottle of beer (355 mL), one 5 oz glass of wine (148 mL), or one 1 oz glass of hard liquor (44 mL).  Lifestyle  Do not use any products that contain nicotine or tobacco. These products include cigarettes, chewing tobacco, and vaping devices, such as e-cigarettes. If you need help quitting, ask your health care provider.  Do not use street drugs.  Do not share needles.  Ask your health care provider for help if you need support or information about quitting drugs.  General instructions  Schedule regular health, dental, and eye exams.  Stay current with your vaccines.  Tell your health care provider if:  You often feel depressed.  You have ever been abused or do not feel safe at home.  Summary  Adopting a healthy lifestyle and getting preventive care are important in promoting health and wellness.  Follow your health care provider's instructions about healthy diet, exercising, and getting tested or screened for diseases.  Follow your health care provider's instructions on monitoring your cholesterol and blood pressure.  This information is not intended to replace advice given to you by your health care provider. Make sure you discuss any questions you have with your health care provider.  Document Revised: 01/15/2021 Document Reviewed: 01/15/2021  Elsevier Patient Education  2024 [employer].

## 2024-07-30 LAB — HEPATITIS A ANTIBODY, TOTAL: Hepatitis A AB,Total: NONREACTIVE

## 2024-07-30 LAB — HEPATITIS B SURFACE ANTIGEN: Hepatitis B Surface Ag: NONREACTIVE

## 2024-07-30 LAB — HEPATITIS B SURFACE ANTIBODY, QUANTITATIVE: Hep B S AB Quant (Post): >1000 m[IU]/mL (ref >=10)

## 2024-07-31 ENCOUNTER — Ambulatory Visit: Payer: Self-pay | Admitting: Internal Medicine

## 2024-08-10 ENCOUNTER — Encounter: Payer: Self-pay | Admitting: Internal Medicine

## 2024-08-11 ENCOUNTER — Other Ambulatory Visit: Payer: Self-pay | Admitting: Internal Medicine

## 2024-08-11 DIAGNOSIS — R39198 Other difficulties with micturition: Secondary | ICD-10-CM | POA: Insufficient documentation

## 2024-08-18 ENCOUNTER — Other Ambulatory Visit

## 2024-08-18 ENCOUNTER — Ambulatory Visit: Payer: Self-pay | Admitting: Internal Medicine

## 2024-08-18 DIAGNOSIS — R39198 Other difficulties with micturition: Secondary | ICD-10-CM | POA: Diagnosis not present

## 2024-08-18 LAB — URINALYSIS, ROUTINE W REFLEX MICROSCOPIC
Bilirubin Urine: NEGATIVE
Hgb urine dipstick: NEGATIVE
Ketones, ur: NEGATIVE
Leukocytes,Ua: NEGATIVE
Nitrite: NEGATIVE
RBC / HPF: NONE SEEN (ref 0–?)
Specific Gravity, Urine: >=1.03 — AB (ref 1.000–1.030)
Total Protein, Urine: NEGATIVE
Urine Glucose: NEGATIVE
Urobilinogen, UA: 0.2 (ref 0.0–1.0)
WBC, UA: NONE SEEN (ref 0–?)
pH: 6 (ref 5.0–8.0)

## 2024-08-18 LAB — PSA: PSA: 0.54 ng/mL (ref 0.10–4.00)

## 2024-09-14 ENCOUNTER — Ambulatory Visit: Admitting: Physician Assistant

## 2024-09-14 ENCOUNTER — Encounter: Payer: Self-pay | Admitting: Physician Assistant

## 2024-09-14 DIAGNOSIS — W908XXA Exposure to other nonionizing radiation, initial encounter: Secondary | ICD-10-CM | POA: Diagnosis not present

## 2024-09-14 DIAGNOSIS — L578 Other skin changes due to chronic exposure to nonionizing radiation: Secondary | ICD-10-CM | POA: Diagnosis not present

## 2024-09-14 DIAGNOSIS — D1801 Hemangioma of skin and subcutaneous tissue: Secondary | ICD-10-CM

## 2024-09-14 DIAGNOSIS — D229 Melanocytic nevi, unspecified: Secondary | ICD-10-CM

## 2024-09-14 DIAGNOSIS — D2239 Melanocytic nevi of other parts of face: Secondary | ICD-10-CM

## 2024-09-14 DIAGNOSIS — L814 Other melanin hyperpigmentation: Secondary | ICD-10-CM

## 2024-09-14 DIAGNOSIS — Z1283 Encounter for screening for malignant neoplasm of skin: Secondary | ICD-10-CM

## 2024-09-14 NOTE — Patient Instructions (Signed)

## 2024-09-14 NOTE — Progress Notes (Signed)
" ° °  New Patient Visit   Subjective  Ka Flammer is a 34 y.o. male NEW PATIENT who presents for the following: Skin Cancer Screening and Full Body Skin Exam - No history of skin cancer  The patient presents for Total-Body Skin Exam (TBSE) for skin cancer screening and mole check. The patient has spots, moles and lesions to be evaluated, some may be new or changing and the patient may have concern these could be cancer.    The following portions of the chart were reviewed this encounter and updated as appropriate: medications, allergies, medical history  Review of Systems:  No other skin or systemic complaints except as noted in HPI or Assessment and Plan.  Objective  Well appearing patient in no apparent distress; mood and affect are within normal limits.  A full examination was performed including scalp, head, eyes, ears, nose, lips, neck, chest, axillae, abdomen, back, buttocks, bilateral upper extremities, bilateral lower extremities, hands, feet, fingers, toes, fingernails, and toenails. All findings within normal limits unless otherwise noted below.   Relevant physical exam findings are noted in the Assessment and Plan.    Assessment & Plan   SKIN CANCER SCREENING PERFORMED TODAY.  ACTINIC DAMAGE - Chronic condition, secondary to cumulative UV/sun exposure - diffuse scaly erythematous macules with underlying dyspigmentation - Recommend daily broad spectrum sunscreen SPF 30+ to sun-exposed areas, reapply every 2 hours as needed.  - Staying in the shade or wearing long sleeves, sun glasses (UVA+UVB protection) and wide brim hats (4-inch brim around the entire circumference of the hat) are also recommended for sun protection.  - Call for new or changing lesions.  LENTIGINES, HEMANGIOMAS - Benign normal skin lesions - Benign-appearing - Call for any changes  MELANOCYTIC NEVI - Tan-brown and/or pink-flesh-colored symmetric macules and papules - Benign appearing on exam today -  Observation - Call clinic for new or changing moles - Recommend daily use of broad spectrum spf 30+ sunscreen to sun-exposed areas.    Angiofibroma/Fibrous Papules of nose - 1-2 mm smooth symmetric flesh colored to pink papule(s) without features suspicious for malignancy on dermoscopy - Benign-appearing.  Observation.  Call clinic for new or changing lesions.        SCREENING EXAM FOR SKIN CANCER   ACTINIC SKIN DAMAGE   CHERRY ANGIOMA   LENTIGINES   MULTIPLE BENIGN NEVI    Return in about 1 year (around 09/14/2025) for TBSE.  I, Roseline Hutchinson, CMA, am acting as scribe for Cason Luffman K, PA-C .   Documentation: I have reviewed the above documentation for accuracy and completeness, and I agree with the above.  Raechelle Sarti K, PA-C    "

## 2025-09-19 ENCOUNTER — Ambulatory Visit: Admitting: Physician Assistant
